# Patient Record
Sex: Male | Born: 1975 | Race: Black or African American | Hispanic: No | Marital: Single | Smoking: Current some day smoker
Health system: Southern US, Community
[De-identification: ages and names within clinical notes are randomized; demographics above are authoritative.]

## PROBLEM LIST (undated history)

## (undated) DIAGNOSIS — I82890 Acute embolism and thrombosis of other specified veins: Secondary | ICD-10-CM

## (undated) DIAGNOSIS — Z87442 Personal history of urinary calculi: Secondary | ICD-10-CM

## (undated) HISTORY — PX: OTHER SURGICAL HISTORY: SHX169

---

## 2005-04-28 ENCOUNTER — Emergency Department: Payer: Self-pay | Admitting: Emergency Medicine

## 2005-04-28 ENCOUNTER — Other Ambulatory Visit: Payer: Self-pay

## 2005-10-04 ENCOUNTER — Emergency Department: Payer: Self-pay | Admitting: Emergency Medicine

## 2006-07-15 ENCOUNTER — Emergency Department: Payer: Self-pay | Admitting: Emergency Medicine

## 2006-08-04 ENCOUNTER — Emergency Department: Payer: Self-pay | Admitting: Internal Medicine

## 2006-08-06 ENCOUNTER — Ambulatory Visit: Payer: Self-pay | Admitting: Internal Medicine

## 2006-10-04 ENCOUNTER — Emergency Department: Payer: Self-pay | Admitting: Unknown Physician Specialty

## 2007-02-27 ENCOUNTER — Emergency Department: Payer: Self-pay | Admitting: Emergency Medicine

## 2007-03-02 ENCOUNTER — Emergency Department: Payer: Self-pay | Admitting: Emergency Medicine

## 2007-03-03 ENCOUNTER — Emergency Department: Payer: Self-pay | Admitting: Emergency Medicine

## 2007-04-14 ENCOUNTER — Emergency Department: Payer: Self-pay | Admitting: Internal Medicine

## 2007-04-19 ENCOUNTER — Emergency Department: Payer: Self-pay | Admitting: Emergency Medicine

## 2007-06-10 ENCOUNTER — Emergency Department: Payer: Self-pay

## 2008-03-19 ENCOUNTER — Emergency Department: Payer: Self-pay | Admitting: Emergency Medicine

## 2009-08-23 ENCOUNTER — Emergency Department: Payer: Self-pay | Admitting: Emergency Medicine

## 2009-12-04 ENCOUNTER — Emergency Department: Payer: Self-pay | Admitting: Emergency Medicine

## 2009-12-17 ENCOUNTER — Emergency Department: Payer: Self-pay | Admitting: Emergency Medicine

## 2010-04-14 ENCOUNTER — Emergency Department: Payer: Self-pay | Admitting: Emergency Medicine

## 2010-11-14 ENCOUNTER — Emergency Department: Payer: Self-pay | Admitting: Internal Medicine

## 2012-05-12 ENCOUNTER — Emergency Department: Payer: Self-pay | Admitting: Emergency Medicine

## 2012-05-13 ENCOUNTER — Emergency Department: Payer: Self-pay | Admitting: Emergency Medicine

## 2012-12-17 ENCOUNTER — Emergency Department: Payer: Self-pay | Admitting: Emergency Medicine

## 2013-01-14 ENCOUNTER — Emergency Department: Payer: Self-pay | Admitting: Emergency Medicine

## 2013-01-14 LAB — COMPREHENSIVE METABOLIC PANEL
Albumin: 4 g/dL (ref 3.4–5.0)
Alkaline Phosphatase: 78 U/L (ref 50–136)
Anion Gap: 0 — ABNORMAL LOW (ref 7–16)
Bilirubin,Total: 0.3 mg/dL (ref 0.2–1.0)
Chloride: 106 mmol/L (ref 98–107)
Co2: 33 mmol/L — ABNORMAL HIGH (ref 21–32)
Glucose: 92 mg/dL (ref 65–99)
Osmolality: 279 (ref 275–301)
SGOT(AST): 28 U/L (ref 15–37)
Sodium: 139 mmol/L (ref 136–145)
Total Protein: 7.3 g/dL (ref 6.4–8.2)

## 2013-01-14 LAB — CBC
HGB: 14 g/dL (ref 13.0–18.0)
MCHC: 32.7 g/dL (ref 32.0–36.0)
Platelet: 186 10*3/uL (ref 150–440)

## 2013-01-14 LAB — URINALYSIS, COMPLETE
Bacteria: NONE SEEN
Blood: NEGATIVE
Glucose,UR: NEGATIVE mg/dL (ref 0–75)
Ketone: NEGATIVE
Nitrite: NEGATIVE
RBC,UR: 1 /HPF (ref 0–5)
WBC UR: 2 /HPF (ref 0–5)

## 2013-04-15 ENCOUNTER — Emergency Department: Payer: Self-pay | Admitting: Emergency Medicine

## 2013-08-31 ENCOUNTER — Emergency Department: Payer: Self-pay | Admitting: Emergency Medicine

## 2013-08-31 LAB — CBC WITH DIFFERENTIAL/PLATELET
Basophil #: 0.1 10*3/uL (ref 0.0–0.1)
Eosinophil %: 0.8 %
HCT: 42.3 % (ref 40.0–52.0)
HGB: 14.2 g/dL (ref 13.0–18.0)
Lymphocyte #: 1 10*3/uL (ref 1.0–3.6)
MCH: 28.9 pg (ref 26.0–34.0)
MCV: 87 fL (ref 80–100)
Monocyte #: 0.9 x10 3/mm (ref 0.2–1.0)
Neutrophil %: 78.9 %
Platelet: 196 10*3/uL (ref 150–440)
RBC: 4.89 10*6/uL (ref 4.40–5.90)
RDW: 16.2 % — ABNORMAL HIGH (ref 11.5–14.5)
WBC: 9.9 10*3/uL (ref 3.8–10.6)

## 2013-08-31 LAB — RAPID INFLUENZA A&B ANTIGENS

## 2013-08-31 LAB — BASIC METABOLIC PANEL
Calcium, Total: 9.7 mg/dL (ref 8.5–10.1)
Co2: 24 mmol/L (ref 21–32)
Creatinine: 1.05 mg/dL (ref 0.60–1.30)
EGFR (African American): >60
EGFR (Non-African Amer.): >60
Glucose: 166 mg/dL — ABNORMAL HIGH (ref 65–99)
Osmolality: 274 (ref 275–301)
Potassium: 3.5 mmol/L (ref 3.5–5.1)

## 2013-09-02 LAB — BETA STREP CULTURE(ARMC)

## 2013-09-05 LAB — CULTURE, BLOOD (SINGLE)

## 2014-01-03 ENCOUNTER — Emergency Department: Payer: Self-pay | Admitting: Emergency Medicine

## 2014-04-28 ENCOUNTER — Ambulatory Visit: Payer: Self-pay | Admitting: Orthopedic Surgery

## 2014-06-03 ENCOUNTER — Emergency Department: Payer: Self-pay | Admitting: Emergency Medicine

## 2015-02-08 ENCOUNTER — Emergency Department
Admission: EM | Admit: 2015-02-08 | Discharge: 2015-02-08 | Disposition: A | Discharge status: Home / Self Care | Payer: BLUE CROSS/BLUE SHIELD | Attending: Emergency Medicine | Admitting: Emergency Medicine

## 2015-02-08 DIAGNOSIS — Y998 Other external cause status: Secondary | ICD-10-CM | POA: Diagnosis not present

## 2015-02-08 DIAGNOSIS — Z720 Tobacco use: Secondary | ICD-10-CM | POA: Insufficient documentation

## 2015-02-08 DIAGNOSIS — Y288XXA Contact with other sharp object, undetermined intent, initial encounter: Secondary | ICD-10-CM | POA: Diagnosis not present

## 2015-02-08 DIAGNOSIS — Z23 Encounter for immunization: Secondary | ICD-10-CM | POA: Insufficient documentation

## 2015-02-08 DIAGNOSIS — Y92009 Unspecified place in unspecified non-institutional (private) residence as the place of occurrence of the external cause: Secondary | ICD-10-CM | POA: Insufficient documentation

## 2015-02-08 DIAGNOSIS — S41112A Laceration without foreign body of left upper arm, initial encounter: Secondary | ICD-10-CM | POA: Insufficient documentation

## 2015-02-08 DIAGNOSIS — Y9389 Activity, other specified: Secondary | ICD-10-CM | POA: Insufficient documentation

## 2015-02-08 HISTORY — DX: Acute embolism and thrombosis of other specified veins: I82.890

## 2015-02-08 MED ORDER — BACITRACIN 500 UNIT/GM EX OINT
1.0000 "application " | TOPICAL_OINTMENT | Freq: Two times a day (BID) | CUTANEOUS | Status: DC
  Administered 2015-02-08: 1 via TOPICAL
  Filled 2015-02-08: qty 0.9

## 2015-02-08 MED ORDER — LIDOCAINE-EPINEPHRINE (PF) 1 %-1:200000 IJ SOLN
30.0000 mL | Freq: Once | INTRAMUSCULAR | Status: AC
  Administered 2015-02-08: 30 mL

## 2015-02-08 MED ORDER — CEPHALEXIN 500 MG PO CAPS: 500.0000 mg | ORAL_CAPSULE | Freq: Four times a day (QID) | ORAL | Status: AC

## 2015-02-08 MED ORDER — OXYCODONE-ACETAMINOPHEN 5-325 MG PO TABS: 2.0000 | ORAL_TABLET | Freq: Once | ORAL | Status: DC

## 2015-02-08 MED ORDER — LIDOCAINE-EPINEPHRINE (PF) 2 %-1:200000 IJ SOLN: 20.0000 mL | Freq: Once | INTRAMUSCULAR | Status: DC

## 2015-02-08 MED ORDER — OXYCODONE-ACETAMINOPHEN 5-325 MG PO TABS: 1.0000 | ORAL_TABLET | Freq: Four times a day (QID) | ORAL | Status: DC | PRN

## 2015-02-08 MED ORDER — BACITRACIN ZINC 500 UNIT/GM EX OINT
TOPICAL_OINTMENT | CUTANEOUS | Status: AC
  Administered 2015-02-08: 1 via TOPICAL
  Filled 2015-02-08: qty 1.8

## 2015-02-08 MED ORDER — LIDOCAINE-EPINEPHRINE (PF) 1 %-1:200000 IJ SOLN
INTRAMUSCULAR | Status: AC
  Administered 2015-02-08: 30 mL
  Filled 2015-02-08: qty 30

## 2015-02-08 MED ORDER — TETANUS-DIPHTH-ACELL PERTUSSIS 5-2.5-18.5 LF-MCG/0.5 IM SUSP
0.5000 mL | Freq: Once | INTRAMUSCULAR | Status: AC
  Administered 2015-02-08: 0.5 mL via INTRAMUSCULAR

## 2015-02-08 MED ORDER — TETANUS-DIPHTH-ACELL PERTUSSIS 5-2.5-18.5 LF-MCG/0.5 IM SUSP
INTRAMUSCULAR | Status: AC
  Administered 2015-02-08: 0.5 mL via INTRAMUSCULAR
  Filled 2015-02-08: qty 0.5
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
## 2015-02-08 NOTE — ED Notes (Signed)
MD at bedside suturing

## 2015-02-08 NOTE — Discharge Instructions (Signed)

## 2015-02-09 ENCOUNTER — Emergency Department
Admission: EM | Admit: 2015-02-09 | Discharge: 2015-02-09 | Disposition: A | Discharge status: Home / Self Care | Payer: BLUE CROSS/BLUE SHIELD | Attending: Student | Admitting: Student

## 2015-02-09 ENCOUNTER — Encounter: Payer: Self-pay | Admitting: Emergency Medicine

## 2015-02-09 DIAGNOSIS — Z4801 Encounter for change or removal of surgical wound dressing: Secondary | ICD-10-CM | POA: Insufficient documentation

## 2015-02-09 DIAGNOSIS — Z720 Tobacco use: Secondary | ICD-10-CM | POA: Diagnosis not present

## 2015-02-09 DIAGNOSIS — Z5189 Encounter for other specified aftercare: Secondary | ICD-10-CM

## 2015-02-09 MED ORDER — BACITRACIN ZINC 500 UNIT/GM EX OINT
TOPICAL_OINTMENT | Freq: Two times a day (BID) | CUTANEOUS | Status: DC
  Administered 2015-02-09: 18:00:00 via TOPICAL

## 2015-02-09 MED ORDER — BACITRACIN ZINC 500 UNIT/GM EX OINT
TOPICAL_OINTMENT | CUTANEOUS | Status: AC
  Filled 2015-02-09: qty 0.9

## 2015-02-09 NOTE — Discharge Instructions (Signed)
Continue home medications. Keep clean with soap and water daily. Rinse, pat dry then apply thin layer of topical antibiotic ointment such as neosporin then apply dressing.   As discussed, return to ER for staple removal in 10-14 days.   Return to ER sooner for increased pain, redness, drainage, decreased range of motion or sensation, new or worsening concerns.

## 2015-02-09 NOTE — ED Notes (Signed)
Pt reports that he has a laceration and was told to come back and have his left arm relooked at. Denies any pain or s/s of infection
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
## 2015-02-24 ENCOUNTER — Encounter: Payer: Self-pay | Admitting: Emergency Medicine

## 2015-02-24 ENCOUNTER — Emergency Department
Admission: EM | Admit: 2015-02-24 | Discharge: 2015-02-24 | Disposition: A | Discharge status: Home / Self Care | Payer: BLUE CROSS/BLUE SHIELD | Attending: Emergency Medicine | Admitting: Emergency Medicine

## 2015-02-24 DIAGNOSIS — Z4802 Encounter for removal of sutures: Secondary | ICD-10-CM | POA: Diagnosis present

## 2015-02-24 DIAGNOSIS — Z4801 Encounter for change or removal of surgical wound dressing: Secondary | ICD-10-CM | POA: Diagnosis not present

## 2015-02-24 DIAGNOSIS — Z720 Tobacco use: Secondary | ICD-10-CM | POA: Insufficient documentation

## 2015-02-24 NOTE — ED Notes (Signed)
17 staples removed from left upper arm.

## 2015-02-24 NOTE — Discharge Instructions (Signed)

## 2015-02-24 NOTE — ED Notes (Signed)
Pt here for staple removal to left upper arm

## 2015-02-24 NOTE — ED Provider Notes (Signed)
[facility] Emergency Department Provider Note  ____________________________________________  Time seen: 1206  I have reviewed the triage vital signs and the nursing notes.   HISTORY  Chief Complaint Suture / Staple Removal   HPI Anthony Levine is a 39 y.o. male is here today for wound check and staple removal. He has a stapled laceration on his left forearm. This was done 15 days ago. Patient denies any problems.   Past Medical History  Diagnosis Date  . Blood clot in abdominal vein     There are no active problems to display for this patient.   Past Surgical History  Procedure Laterality Date  . Blood clot removal from abdomen      Current Outpatient Rx  Name  Route  Sig  Dispense  Refill  . oxyCODONE-acetaminophen (ROXICET) 5-325 MG per tablet   Oral   Take 1 tablet by mouth every 6 (six) hours as needed.   20 tablet   0     Allergies Review of patient's allergies indicates no known allergies.  No family history on file.  Social History History  Substance Use Topics  . Smoking status: Current Every Day Smoker -- 0.50 packs/day    Types: Cigarettes  . Smokeless tobacco: Not on file  . Alcohol Use: No    Review of Systems Constitutional: No fever/chills _________________________________________   PHYSICAL EXAM:  VITAL SIGNS: ED Triage Vitals  Enc Vitals Group     BP --      Pulse --      Resp --      Temp --      Temp src --      SpO2 --      Weight --      Height --      Head Cir --      Peak Flow --      Pain Score --      Pain Loc --      Pain Edu? --      Excl. in GC? --     Constitutional: Alert and oriented. Well appearing and in no acute distress. Eyes: Conjunctivae are normal. PERRL. EOMI. Head: Atraumatic. Nose: No congestion/rhinnorhea. Neck: No stridor.   Respiratory: Normal respiratory effort. Musculoskeletal: No lower extremity tenderness nor edema.  No joint effusions. Neurologic:   Normal speech and language. No gross focal neurologic deficits are appreciated. Speech is normal. No gait instability. Skin:  Skin is warm, dry and intact. Laceration is healed well without any signs of infection. Psychiatric: Mood and affect are normal. Speech and behavior are normal.  ____________________________________________   LABS (all labs ordered are listed, but only abnormal results are displayed)  Labs Reviewed - No data to display PROCEDURES  Procedure(s) performed: Staples were removed by RN  Critical Care performed: No  ____________________________________________   INITIAL IMPRESSION / ASSESSMENT AND PLAN / ED COURSE  Pertinent labs & imaging results that were available during my care of the patient were reviewed by me and considered in my medical decision making (see chart for details).  Patient [facility] if any problems [facility]____________   FINAL CLINICAL IMPRESSION(S) / ED DIAGNOSES  Final diagnoses:  Encounter for staple removal      Tommi Rumpshonda L Summers, PA-C 02/24/15 1215

## 2015-07-25 ENCOUNTER — Emergency Department: Payer: BLUE CROSS/BLUE SHIELD

## 2015-07-25 ENCOUNTER — Emergency Department
Admission: EM | Admit: 2015-07-25 | Discharge: 2015-07-25 | Disposition: A | Discharge status: Home / Self Care | Payer: BLUE CROSS/BLUE SHIELD | Attending: Emergency Medicine | Admitting: Emergency Medicine

## 2015-07-25 ENCOUNTER — Encounter: Payer: Self-pay | Admitting: Adult Health

## 2015-07-25 DIAGNOSIS — M6588 Other synovitis and tenosynovitis, other site: Secondary | ICD-10-CM | POA: Diagnosis not present

## 2015-07-25 DIAGNOSIS — Z720 Tobacco use: Secondary | ICD-10-CM | POA: Diagnosis not present

## 2015-07-25 DIAGNOSIS — G8929 Other chronic pain: Secondary | ICD-10-CM | POA: Diagnosis not present

## 2015-07-25 DIAGNOSIS — M19132 Post-traumatic osteoarthritis, left wrist: Secondary | ICD-10-CM | POA: Diagnosis not present

## 2015-07-25 DIAGNOSIS — M25532 Pain in left wrist: Secondary | ICD-10-CM

## 2015-07-25 DIAGNOSIS — M778 Other enthesopathies, not elsewhere classified: Secondary | ICD-10-CM

## 2015-07-25 MED ORDER — MELOXICAM 15 MG PO TABS: 15.0000 mg | ORAL_TABLET | Freq: Every day | ORAL | Status: DC

## 2015-07-25 NOTE — ED Notes (Signed)
Presents with left wrist pain began 6 months ago- endorses difficulty picking things up, trouble moving wrist from side to side. Cms intact
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
## 2015-07-25 NOTE — Discharge Instructions (Signed)
Emergency care providers appreciate that many patients coming to us are in severe pain and we wish to address their pain in the safest, most responsible manner.  It is important to recognize, however, that the proper treatment of chronic pain differs from that of the pain of injuries and acute illnesses.  Our goal is to provider quality, safe, personalized care and we thank you for giving us the opportunity to serve you.  The use of narcotics and related agents for chronic pain syndromes may lead to additional physical and psychological problems.  Nearly as many people die from prescription narcotics each year as die from car crashes.  Additionally, this risk is increased if such prescriptions are obtained from a variety of sources.  Therefore, only your primary care physician or a pain management specialist is able to safely treat such syndromes with narcotic medications long-term.  Documentation revealing such prescriptions have been sought from multiple sources may prohibit us from providing a refill or different narcotic medication.  Your name may be checked first through the [place] Controlled Substances Reporting System.  This database is a record of controlled substance medication prescriptions that the patient has received.  This has been established by [place] in an effort to eliminate the dangerous, and often life-threatening, practice of obtaining multiple prescriptions from different medical providers.  If you have a chronic pain syndrome (i.e. chronic headaches, recurrent back or neck pain, dental pain, abdominal or pelvic pain without a specific diagnosis, or neuropathic pain such as fibromyalgia) or recurrent visits for the same condition without an acute diagnosis, you may be treated with non-narcotics and other non-addictive medicines.  Allergic reactions or negative side effects that may be reported by a patient to such medications will not typically lead to the use of a  narcotic analgesic or other controlled substance as an alternative.  Patients managing chronic pain with a personal physician should have provisions in place for breakthrough pain.  If you are in crisis, you should call your physician.  If your physician directs you to the emergency department, please have the doctor call and speak to our attending physician concerning your care.  When patients come to the Emergency Department (ED) with acute medical conditions in which the ED physician feels it is appropriate to prescribe narcotic or sedating pain medication, the physician will prescribe these in very limited quantities.  The amount of these medications will last only until you can see your primary care physician in his/her office.  Any patient who returns to the ED seeking refills should expect only non-narcotic pain medications.  In the event an acute medical condition exists and the emergency physician feels it is necessary that the patient be given a narcotic or sedating medication, a responsible adult driver should be present in the room prior to the medication being given by the nurse.  Prescriptions for narcotic or sedating medications that have been lost, stolen, or expired will NOT be refilled in the ED.  Patients who have chronic pain may receive non-narcotic prescriptions until seen by their primary care physician.  It is every patient's personal responsibility to maintain active prescriptions with his or her primary care physician or specialist.  

## 2015-10-27 ENCOUNTER — Encounter: Payer: Self-pay | Admitting: Emergency Medicine

## 2015-10-27 ENCOUNTER — Emergency Department
Admission: EM | Admit: 2015-10-27 | Discharge: 2015-10-27 | Disposition: A | Discharge status: Home / Self Care | Payer: BLUE CROSS/BLUE SHIELD | Attending: Student | Admitting: Student

## 2015-10-27 DIAGNOSIS — N4889 Other specified disorders of penis: Secondary | ICD-10-CM | POA: Diagnosis present

## 2015-10-27 DIAGNOSIS — L739 Follicular disorder, unspecified: Secondary | ICD-10-CM | POA: Insufficient documentation

## 2015-10-27 DIAGNOSIS — F17210 Nicotine dependence, cigarettes, uncomplicated: Secondary | ICD-10-CM | POA: Insufficient documentation

## 2015-10-27 DIAGNOSIS — Z791 Long term (current) use of non-steroidal anti-inflammatories (NSAID): Secondary | ICD-10-CM | POA: Insufficient documentation

## 2015-10-27 MED ORDER — CEPHALEXIN 500 MG PO CAPS: 500.0000 mg | ORAL_CAPSULE | Freq: Four times a day (QID) | ORAL | Status: DC

## 2015-10-27 NOTE — ED Provider Notes (Signed)
[facility] Emergency Department Provider Note  ____________________________________________  Time seen: Approximately 3:16 PM  I have reviewed the triage vital signs and the nursing notes.   HISTORY  Chief Complaint "bump on penis"     HPI Anthony Levine is a 40 y.o. male who presents to emergency department complaining of a "bump" on his penis. Patient states that he was shaving approximately a week ago and afterwards he noticed a bump form. He states that he squeezed it and "popped it." He does endorse some redness, swelling, pain to the area. Patient has not tried any medications prior to arrival. Patient denies fever chills, nausea vomiting, abdominal pain. He denies any dysuria, hematuria, polyuria, or penile discharge.   Past Medical History  Diagnosis Date  . Blood clot in abdominal vein     There are no active problems to display for this patient.   Past Surgical History  Procedure Laterality Date  . Blood clot removal from abdomen      Current Outpatient Rx  Name  Route  Sig  Dispense  Refill  . cephALEXin (KEFLEX) 500 MG capsule   Oral   Take 1 capsule (500 mg total) by mouth 4 (four) times daily.   28 capsule   0   . meloxicam (MOBIC) 15 MG tablet   Oral   Take 1 tablet (15 mg total) by mouth daily.   30 tablet   2   . oxyCODONE-acetaminophen (ROXICET) 5-325 MG per tablet   Oral   Take 1 tablet by mouth every 6 (six) hours as needed.   20 tablet   0     Allergies Tylenol  History reviewed. No pertinent family history.  Social History Social History  Substance Use Topics  . Smoking status: Current Every Day Smoker -- 0.50 packs/day    Types: Cigarettes  . Smokeless tobacco: None  . Alcohol Use: No     Review of Systems  Constitutional: No fever/chills Eyes: No visual changes. No discharge ENT: No sore throat. Cardiovascular: no chest pain. Respiratory: no cough. No SOB. Gastrointestinal: No  abdominal pain.  No nausea, no vomiting.  No diarrhea.  No constipation. Genitourinary: Negative for dysuria. No hematuria. positive for "bump"on penis Musculoskeletal: Negative for back pain. Skin: Negative for rash. Neurological: Negative for headaches, focal weakness or numbness. 10-point ROS otherwise negative.  ____________________________________________   PHYSICAL EXAM:  VITAL SIGNS: ED Triage Vitals  Enc Vitals Group     BP 10/27/15 1428 151/101 mmHg     Pulse Rate 10/27/15 1428 103     Resp 10/27/15 1428 18     Temp 10/27/15 1428 98.3 F (36.8 C)     Temp Source 10/27/15 1428 Oral     SpO2 10/27/15 1428 97 %     Weight 10/27/15 1428 185 lb (83.915 kg)     Height 10/27/15 1428  (1.803 m)     Head Cir --      Peak Flow --      Pain Score --      Pain Loc --      Pain Edu? --      Excl. in GC? --      Constitutional: Alert and oriented. Well appearing and in no acute distress. Eyes: Conjunctivae are normal. PERRL. EOMI. Head: Atraumatic. ENT:      Ears:       Nose: No congestion/rhinnorhea.      Mouth/Throat: Mucous membranes are moist.  Neck: No stridor.  Hematological/Lymphatic/Immunilogical: No cervical lymphadenopathy. Cardiovascular: Normal rate, regular rhythm. Normal S1 and S2.  Good peripheral circulation. Respiratory: Normal respiratory effort without tachypnea or retractions. Lungs CTAB. Gastrointestinal: Soft and nontender. No distention. No CVA tenderness. Genitourinary: Erythematous, edematous lesion noted to the base of the penis. No drainage noted. No fluctuance noted. Area is firm to palpation. Area is tender to palpation. No chancres. No discharge. Testicular exam is unremarkable. Musculoskeletal: No lower extremity tenderness nor edema.  No joint effusions. Neurologic:  Normal speech and language. No gross focal neurologic deficits are appreciated.  Skin:  Skin is warm, dry and intact. No rash noted. Psychiatric: Mood and affect are  normal. Speech and behavior are normal. Patient exhibits appropriate insight and judgement.   ____________________________________________   LABS (all labs ordered are listed, but only abnormal results are displayed)  Labs Reviewed - No data to display ____________________________________________  EKG   ____________________________________________  RADIOLOGY   No results found.  ____________________________________________    PROCEDURES  Procedure(s) performed:       Medications - No data to display   ____________________________________________   INITIAL IMPRESSION / ASSESSMENT AND PLAN / ED COURSE  Pertinent labs & imaging results that were available during my care of the patient were reviewed by me and considered in my medical decision making (see chart for details).  Patient's diagnosis is consistent with folliculitis. Patient will be discharged home with prescriptions for antibiotics. Patient is to follow up with primary care provider if symptoms persist past this treatment course. Patient is given ED precautions to return to the ED for any worsening or new symptoms.     ____________________________________________  FINAL CLINICAL IMPRESSION(S) / ED DIAGNOSES  Final diagnoses:  Folliculitis      NEW MEDICATIONS STARTED DURING THIS VISIT:  New Prescriptions   CEPHALEXIN (KEFLEX) 500 MG CAPSULE    Take 1 capsule (500 mg total) by mouth 4 (four) times daily.        Delorise Royals Keyshla Tunison, PA-C 10/27/15 1522  Gayla Doss, MD 10/28/15 [phone]

## 2015-10-27 NOTE — Discharge Instructions (Signed)
Folliculitis °Folliculitis is redness, soreness, and swelling (inflammation) of the hair follicles. This condition can occur anywhere on the body. People with weakened immune systems, diabetes, or obesity have a greater risk of getting folliculitis. °CAUSES °· Bacterial infection. This is the most common cause. °· Fungal infection. °· Viral infection. °· Contact with certain chemicals, especially oils and tars. °Long-term folliculitis can result from bacteria that live in the nostrils. The bacteria may trigger multiple outbreaks of folliculitis over time. °SYMPTOMS °Folliculitis most commonly occurs on the scalp, thighs, legs, back, buttocks, and areas where hair is shaved frequently. An early sign of folliculitis is a small, white or yellow, pus-filled, itchy lesion (pustule). These lesions appear on a red, inflamed follicle. They are usually less than 0.2 inches (5 mm) wide. When there is an infection of the follicle that goes deeper, it becomes a boil or furuncle. A group of closely packed boils creates a larger lesion (carbuncle). Carbuncles tend to occur in hairy, sweaty areas of the body. °DIAGNOSIS  °Your caregiver can usually tell what is wrong by doing a physical exam. A sample may be taken from one of the lesions and tested in a lab. This can help determine what is causing your folliculitis. °TREATMENT  °Treatment may include: °· Applying warm compresses to the affected areas. °· Taking antibiotic medicines orally or applying them to the skin. °· Draining the lesions if they contain a large amount of pus or fluid. °· Laser hair removal for cases of long-lasting folliculitis. This helps to prevent regrowth of the hair. °HOME CARE INSTRUCTIONS °· Apply warm compresses to the affected areas as directed by your caregiver. °· If antibiotics are prescribed, take them as directed. Finish them even if you start to feel better. °· You may take over-the-counter medicines to relieve itching. °· Do not shave irritated  skin. °· Follow up with your caregiver as directed. °SEEK IMMEDIATE MEDICAL CARE IF:  °· You have increasing redness, swelling, or pain in the affected area. °· You have a fever. °MAKE SURE YOU: °· Understand these instructions. °· Will watch your condition. °· Will get help right away if you are not doing well or get worse. °  °This information is not intended to replace advice given to you by your health care provider. Make sure you discuss any questions you have with your health care provider. °  °Document Released: 11/27/2001 Document Revised: 10/09/2014 Document Reviewed: 12/19/2011 °Elsevier Interactive Patient Education ©2016 Elsevier Inc. ° °

## 2015-10-27 NOTE — ED Notes (Signed)
Pt reports "hair bump" to penis.  Started one week ago. Reports he popped it today.

## 2015-12-27 ENCOUNTER — Emergency Department
Admission: EM | Admit: 2015-12-27 | Discharge: 2015-12-27 | Disposition: A | Discharge status: Home / Self Care | Payer: BLUE CROSS/BLUE SHIELD | Attending: Emergency Medicine | Admitting: Emergency Medicine

## 2015-12-27 DIAGNOSIS — Z886 Allergy status to analgesic agent status: Secondary | ICD-10-CM | POA: Diagnosis not present

## 2015-12-27 DIAGNOSIS — F17210 Nicotine dependence, cigarettes, uncomplicated: Secondary | ICD-10-CM | POA: Insufficient documentation

## 2015-12-27 DIAGNOSIS — K0889 Other specified disorders of teeth and supporting structures: Secondary | ICD-10-CM | POA: Diagnosis present

## 2015-12-27 DIAGNOSIS — K047 Periapical abscess without sinus: Secondary | ICD-10-CM

## 2015-12-27 MED ORDER — HYDROCODONE-ACETAMINOPHEN 5-325 MG PO TABS
1.0000 | ORAL_TABLET | ORAL | Status: DC | PRN
Start: 2015-12-27 — End: 2016-12-26

## 2015-12-27 MED ORDER — LIDOCAINE VISCOUS 2 % MT SOLN: 20.0000 mL | OROMUCOSAL | Status: DC | PRN

## 2015-12-27 MED ORDER — AMOXICILLIN 500 MG PO TABS: 500.0000 mg | ORAL_TABLET | Freq: Three times a day (TID) | ORAL | Status: DC

## 2015-12-27 NOTE — ED Provider Notes (Signed)
[facility] Emergency Department Provider Note  ____________________________________________  Time seen: Approximately 10:20 AM  I have reviewed the triage vital signs and the nursing notes.   HISTORY  Chief Complaint Dental Pain    HPI Anthony Levine is a 40 y.o. male    Past Medical History  Diagnosis Date  . Blood clot in abdominal vein     There are no active problems to display for this patient.   Past Surgical History  Procedure Laterality Date  . Blood clot removal from abdomen      Current Outpatient Rx  Name  Route  Sig  Dispense  Refill  . amoxicillin (AMOXIL) 500 MG tablet   Oral   Take 1 tablet (500 mg total) by mouth 3 (three) times daily.   30 tablet   0   . HYDROcodone-acetaminophen (NORCO) 5-325 MG tablet   Oral   Take 1-2 tablets by mouth every 4 (four) hours as needed for moderate pain.   15 tablet   0   . lidocaine (XYLOCAINE) 2 % solution   Mouth/Throat   Use as directed 20 mLs in the mouth or throat as needed for mouth pain.   100 mL   0     Allergies Tylenol  No family history on file.  Social History Social History  Substance Use Topics  . Smoking status: Current Every Day Smoker -- 0.50 packs/day    Types: Cigarettes  . Smokeless tobacco: None  . Alcohol Use: No    Review of Systems Constitutional: No fever/chills Eyes: No visual changes. ENT: Positive for dental pain Cardiovascular: Denies chest pain. Musculoskeletal: Negative for back pain. Skin: Negative for rash. Neurological: Negative for headaches, focal weakness or numbness.  10-point ROS otherwise negative.  ____________________________________________   PHYSICAL EXAM:  VITAL SIGNS: ED Triage Vitals  Enc Vitals Group     BP 12/27/15 0922 153/90 mmHg     Pulse Rate 12/27/15 0922 68     Resp 12/27/15 0922 17     Temp 12/27/15 0922 97.7 F (36.5 C)     Temp Source 12/27/15 0922 Oral     SpO2 12/27/15 0922 100 %      Weight 12/27/15 0922 175 lb (79.379 kg)     Height 12/27/15 0922 5\' 11"  (1.803 m)     Head Cir --      Peak Flow --      Pain Score 12/27/15 0923 10     Pain Loc --      Pain Edu? --      Excl. in GC? --     Constitutional: Alert and oriented. Well appearing and in no acute distress. Mouth/Throat: Mucous membranes are moist.  Oropharynx non-erythematous. Positive dental caries right upper molar with extreme tenderness and erythema around the gums. Neck: No stridor.   Cardiovascular: Normal rate, regular rhythm. Grossly normal heart sounds.  Good peripheral circulation. Respiratory: Normal respiratory effort.  No retractions. Lungs CTAB. Neurologic:  Normal speech and language. No gross focal neurologic deficits are appreciated. No gait instability. Psychiatric: Mood and affect are normal. Speech and behavior are normal.  ____________________________________________   LABS (all labs ordered are listed, but only abnormal results are displayed)  Labs Reviewed - No data to display ____________________________________________    PROCEDURES  Procedure(s) performed: None  Critical Care performed: No  ____________________________________________   INITIAL IMPRESSION / ASSESSMENT AND PLAN / ED COURSE  Pertinent labs & imaging results that were available during my care  of the patient were reviewed by me and considered in my medical decision making (see chart for details).  Acute dental pain with abscess. Rx given for amoxicillin 500 mg twice a day and Vicodin 5/325. Viscous lidocaine given. A list of dental providers in the area was also given to the patient. Work excuse 24 hours. ____________________________________________   FINAL CLINICAL IMPRESSION(S) / ED DIAGNOSES  Final diagnoses:  Dental abscess     This chart was dictated using voice recognition software/Dragon. Despite best efforts to proofread, errors can occur which can change the meaning. Any change was  purely unintentional.   Evangeline Dakin, PA-C 12/27/15 [address] Anthony Whitelaw, PA-C 12/29/15 1515  Rockne Menghini, MD 12/29/15 1845

## 2015-12-27 NOTE — Discharge Instructions (Signed)
OPTIONS FOR DENTAL FOLLOW UP CARE ° °[place] Department of Health and Human Services - Local Safety Net Dental Clinics °http://www.ncdhhs.gov/dph/oralhealth/services/safetynetclinics.htm °  °[facility] (336-562-3123) ° °[facility] ([phone]) ° °[facility] ([phone] ext [phone]) ° °[facility] ([phone]) ° °[facility] ([phone]) °This clinic caters to the indigent population and is on a lottery system. °Location: °[facility], [address], [place] °Clinic Hours: °Wednesdays from 6pm - 9pm, patients seen by a lottery system. °For dates, call or go to [email or web address]/[facility]/patients/[email or web address] °Services: °Cleanings, fillings and simple extractions. °Payment Options: °DENTAL WORK IS FREE OF CHARGE. Bring proof of income or support. °Best way to get seen: °Arrive at 5:15 pm - this is a lottery, NOT first come/first serve, so arriving earlier will not increase your chances of being seen. °  °  °[facility] °[phone] °Select option 1 for emergencies °  °Location: °[facility], [address], [place] °Clinic Hours: °No walk-ins accepted - call the day before to schedule an appointment. °Check in times are 9:30 am and 1:30 pm. °Services: °Simple extractions, temporary fillings, pulpectomy/pulp debridement, uncomplicated abscess drainage. °Payment Options: °PAYMENT IS DUE AT THE TIME OF SERVICE.  Fee is usually $100-200, additional surgical procedures (e.g. abscess drainage) may be extra. °Cash, checks, Visa/MasterCard accepted.  Can file Medicaid if patient is covered for dental - patient should call case worker to check. °No discount for [facility] patients. °Best way to get seen: °MUST call the day before and get onto the schedule. Can usually be seen the next 1-2 days. No walk-ins accepted. °  °  °[facility] °[phone] °   °Location: °[facility], [address], [place] °Clinic Hours: °M, W, Th, F 8am or 1:30pm, Tues 9a or 1:30 - first come/first served. °Services: °Simple extractions, temporary fillings, uncomplicated abscess drainage.  You do not need to be an [place] resident. °Payment Options: °PAYMENT IS DUE AT THE TIME OF SERVICE. °Dental insurance, otherwise sliding scale - bring proof of income or support. °Depending on income and treatment needed, cost is usually $50-200. °Best way to get seen: °Arrive early as it is first come/first served. °  °  °[facility] °[phone] °  °Location: °[address] °Clinic Hours: °Mon-Thu 8a-5p °Services: °Most basic dental services including extractions and fillings. °Payment Options: °PAYMENT IS DUE AT THE TIME OF SERVICE. °Sliding scale, up to 50% off - bring proof if income or support. °Medicaid with dental option accepted. °Best way to get seen: °Call to schedule an appointment, can usually be seen within 2 weeks OR they will try to see walk-ins - show up at 8a or 2p (you may have to wait). °  °  °[facility] °[phone] °[place] RESIDENTS ONLY °  °Location: °[facility], [address], [place],  [address] °Clinic Hours: By appointment only. °Monday - Thursday 8am-5pm, Friday 8am-12pm °Services: Cleanings, fillings, extractions. °Payment Options: °PAYMENT IS DUE AT THE TIME OF SERVICE. °Cash, Visa or MasterCard. Sliding scale - $30 minimum per service. °Best way to get seen: °Come in to office, complete packet and make an appointment - need proof of income °or support monies for each household member and proof of [place] residence. °Usually takes about a month to get in. °  °  °[facility] °[phone] °  °Location: °[address]   Minor Clinic Hours: Walk-in Urgent Care Dental Services are offered Monday-Friday  mornings only. The numbers of emergencies accepted daily is limited to the number of providers available. Maximum 15 - Mondays, Wednesdays & Thursdays Maximum 10 - Tuesdays & Fridays Services: You do not need to be a [place] resident to be seen for a dental emergency. Emergencies are defined as pain, swelling, abnormal bleeding, or dental trauma. Walkins will receive x-rays if needed. NOTE: Dental cleaning is not an emergency. Payment Options: PAYMENT IS DUE AT THE TIME OF SERVICE. Minimum co-pay is $40.00 for uninsured patients. Minimum co-pay is $3.00 for Medicaid with dental coverage. Dental Insurance is accepted and must be presented at time of visit. Medicare does not cover dental. Forms of payment: Cash, credit card, checks. Best way to get seen: If not previously registered with the clinic, walk-in dental registration begins at 7:15 am and is on a first come/first serve basis. If previously registered with the clinic, call to make an appointment.     [facility] [phone] [place] RESIDENTS ONLY   Location: [address], [place], [place] Clinic Hours: Mon-Thu 10a-2p Services: Extractions only! Payment Options: FREE (donations accepted) - bring proof of income or support Best way to get seen: Call and schedule an appointment OR come at 8am on the 1st Monday of every month (except for holidays) when it is first come/first served.     [facility] [phone]   Location: [address], [place] Clinic Hours: Friday mornings Services, Payment Options, Best way to get seen: Call for info Abscess An abscess is an infected area that contains a collection of pus and debris.It can occur in almost any part of the body. An abscess is also known as a furuncle or boil. CAUSES  An abscess occurs when tissue gets infected. This can occur from blockage of oil or sweat glands, infection of hair follicles, or a minor injury to the skin. As the body  tries to fight the infection, pus collects in the area and creates pressure under the skin. This pressure causes pain. People with weakened immune systems have difficulty fighting infections and get certain abscesses more often.  SYMPTOMS Usually an abscess develops on the skin and becomes a painful mass that is red, warm, and tender. If the abscess forms under the skin, you may feel a moveable soft area under the skin. Some abscesses break open (rupture) on their own, but most will continue to get worse without care. The infection can spread deeper into the body and eventually into the bloodstream, causing you to feel ill.  DIAGNOSIS  Your caregiver will take your medical history and perform a physical exam. A sample of fluid may also be taken from the abscess to determine what is causing your infection. TREATMENT  Your caregiver may prescribe antibiotic medicines to fight the infection. However, taking antibiotics alone usually does not cure an abscess. Your caregiver may need to make a small cut (incision) in the abscess to drain the pus. In some cases, gauze is packed into the abscess to reduce pain and to continue draining the area. HOME CARE INSTRUCTIONS   Only take over-the-counter or prescription medicines for pain, discomfort, or fever as directed by your caregiver.  If you were prescribed antibiotics, take them as directed. Finish them even if you start to feel better.  If gauze is used, follow your caregiver's directions for changing the gauze.  To avoid spreading the infection:  Keep your draining  abscess covered with a bandage.  Wash your hands well.  Do not share personal care items, towels, or whirlpools with others.  Avoid skin contact with others.  Keep your skin and clothes clean around the abscess.  Keep all follow-up appointments as directed by your caregiver. SEEK MEDICAL CARE IF:   You have increased pain, swelling, redness, fluid drainage, or bleeding.  You have  muscle aches, chills, or a general ill feeling.  You have a fever. MAKE SURE YOU:   Understand these instructions.  Will watch your condition.  Will get help right away if you are not doing well or get worse.   This information is not intended to replace advice given to you by your health care provider. Make sure you discuss any questions you have with your health care provider.   Document Released: 06/28/2005 Document Revised: 03/19/2012 Document Reviewed: 12/01/2011 Elsevier Interactive Patient Education [employer]2016 Elsevier Inc.

## 2015-12-27 NOTE — ED Notes (Signed)
States pain to right side of face for the past 2 days   Denies any trauma to that side of face and denies any broken tooth

## 2015-12-27 NOTE — ED Notes (Signed)
Pt c/o right upper toothache for the past couple of days

## 2016-01-23 ENCOUNTER — Emergency Department
Admission: EM | Admit: 2016-01-23 | Discharge: 2016-01-23 | Disposition: A | Discharge status: Home / Self Care | Payer: BLUE CROSS/BLUE SHIELD | Attending: Emergency Medicine | Admitting: Emergency Medicine

## 2016-01-23 ENCOUNTER — Encounter: Payer: Self-pay | Admitting: Emergency Medicine

## 2016-01-23 DIAGNOSIS — B360 Pityriasis versicolor: Secondary | ICD-10-CM | POA: Diagnosis not present

## 2016-01-23 DIAGNOSIS — F17210 Nicotine dependence, cigarettes, uncomplicated: Secondary | ICD-10-CM | POA: Insufficient documentation

## 2016-01-23 DIAGNOSIS — R21 Rash and other nonspecific skin eruption: Secondary | ICD-10-CM | POA: Diagnosis present

## 2016-01-23 MED ORDER — HYDROXYZINE PAMOATE 25 MG PO CAPS: 25.0000 mg | ORAL_CAPSULE | Freq: Three times a day (TID) | ORAL | Status: DC | PRN

## 2016-01-23 MED ORDER — FLUCONAZOLE 100 MG PO TABS: 100.0000 mg | ORAL_TABLET | ORAL | Status: AC

## 2016-01-23 MED ORDER — KETOCONAZOLE 2 % EX CREA: 1.0000 "application " | TOPICAL_CREAM | Freq: Every day | CUTANEOUS | Status: DC

## 2016-01-23 NOTE — Discharge Instructions (Signed)
Tinea Versicolor Tinea versicolor is a common fungal infection of the skin. It causes a rash that appears as light or dark patches on the skin. The rash most often occurs on the chest, back, neck, or upper arms. This condition is more common during warm weather. Other than affecting how your skin looks, tinea versicolor usually does not cause other problems. In most cases, the infection goes away in a few weeks with treatment. It may take a few months for the patches on your skin to clear up. CAUSES Tinea versicolor occurs when a type of fungus that is normally present on the skin starts to overgrow. This fungus is a kind of yeast. The exact cause of the overgrowth is not known. This condition cannot be passed from one person to another (noncontagious). RISK FACTORS This condition is more likely to develop when certain factors are present, such as:  Heat and humidity.  Sweating too much.  Hormone changes.  Oily skin.  A weak defense (immune) system. SYMPTOMS Symptoms of this condition may include:  A rash on your skin that is made up of light or dark patches. The rash may have:  Patches of tan or pink spots on light skin.  Patches of white or brown spots on dark skin.  Patches of skin that do not tan.  Well-marked edges.  Scales on the discolored areas.  Mild itching. DIAGNOSIS A health care provider can usually diagnose this condition by looking at your skin. During the exam, he or she may use ultraviolet light to help determine the extent of the infection. In some cases, a skin sample may be taken by scraping the rash. This sample will be viewed under a microscope to check for yeast overgrowth. TREATMENT Treatment for this condition may include:  Dandruff shampoo that is applied to the affected skin during showers or bathing.  Over-the-counter medicated skin cream, lotion, or soaps.  Prescription antifungal medicine in the form of skin cream or pills.  Medicine to help  reduce itching. HOME CARE INSTRUCTIONS  Take medicines only as directed by your health care provider.  Apply dandruff shampoo to the affected area if told to do so by your health care provider. You may be instructed to scrub the affected skin for several minutes each day.  Do not scratch the affected area of skin.  Avoid hot and humid conditions.  Do not use tanning booths.  Try to avoid sweating a lot. SEEK MEDICAL CARE IF:  Your symptoms get worse.  You have a fever.  You have redness, swelling, or pain at the site of your rash.  You have fluid, blood, or pus coming from your rash.  Your rash returns after treatment.   This information is not intended to replace advice given to you by your health care provider. Make sure you discuss any questions you have with your health care provider.   Document Released: 09/15/2000 Document Revised: 10/09/2014 Document Reviewed: 06/30/2014 Elsevier Interactive Patient Education 2016 Elsevier Inc.  

## 2016-01-23 NOTE — ED Provider Notes (Signed)
[facility] Emergency Department Provider Note  ____________________________________________  Time seen: Approximately 2:29 PM  I have reviewed the triage vital signs and the nursing notes.   HISTORY  Chief Complaint Rash    HPI Anthony Levine is a 40 y.o. male , NAD, presents to the emergency department with a few week history of rash about his face and scalp. States he's had this rash off and on since he was a child. Has been treated by dermatology in the past. The last time that he when he was given a medicated shampoo which she felt made the rash and itching worse. Notes that the rash was further spread about his body as a child including his back but has no complaints of diffuse rash at this time. As noted that the rash is itchy, paralleling comparison to his normal skin tone and only affects his scalp and around the right side of his neck. He believes the rash was manifested after taking amoxicillin for tooth abscess approximately one month ago. Denies any cough, wheeze, shortness breath, swelling about the tongue, throat, mouth. No known exposures. He has not treated the rash with anything over-the-counter at this time.   Past Medical History  Diagnosis Date  . Blood clot in abdominal vein     There are no active problems to display for this patient.   Past Surgical History  Procedure Laterality Date  . Blood clot removal from abdomen      Current Outpatient Rx  Name  Route  Sig  Dispense  Refill  . amoxicillin (AMOXIL) 500 MG tablet   Oral   Take 1 tablet (500 mg total) by mouth 3 (three) times daily.   30 tablet   0   . fluconazole (DIFLUCAN) 100 MG tablet   Oral   Take 1 tablet (100 mg total) by mouth once a week. for 2 weeks   2 tablet   0   . HYDROcodone-acetaminophen (NORCO) 5-325 MG tablet   Oral   Take 1-2 tablets by mouth every 4 (four) hours as needed for moderate pain.   15 tablet   0   . hydrOXYzine (VISTARIL)  25 MG capsule   Oral   Take 1 capsule (25 mg total) by mouth 3 (three) times daily as needed.   21 capsule   0   . ketoconazole (NIZORAL) 2 % cream   Topical   Apply 1 application topically daily.   30 g   0   . lidocaine (XYLOCAINE) 2 % solution   Mouth/Throat   Use as directed 20 mLs in the mouth or throat as needed for mouth pain.   100 mL   0     Allergies Tylenol  History reviewed. No pertinent family history.  Social History Social History  Substance Use Topics  . Smoking status: Current Every Day Smoker -- 0.50 packs/day    Types: Cigarettes  . Smokeless tobacco: None  . Alcohol Use: No     Review of Systems  Constitutional: No fever/chills, fatigue Eyes: No visual changes. No discharge, redness, swelling ENT: No sore throat or tongue swelling, throat swelling. Cardiovascular: No chest pain. Respiratory: No cough. No shortness of breath. No wheezing.  Gastrointestinal: No abdominal pain.  No nausea, vomiting. Musculoskeletal: Negative for neck pain.  Skin: Positive for rash. Neurological: Negative for headaches, focal weakness or numbness. 10-point ROS otherwise negative.  ____________________________________________   PHYSICAL EXAM:  VITAL SIGNS: ED Triage Vitals  Enc Vitals Group  BP 01/23/16 1321 153/76 mmHg     Pulse Rate 01/23/16 1321 60     Resp 01/23/16 1321 18     Temp 01/23/16 1321 98.3 F (36.8 C)     Temp Source 01/23/16 1321 Oral     SpO2 01/23/16 1321 99 %     Weight 01/23/16 1321 175 lb (79.379 kg)     Height 01/23/16 1321  (1.803 m)     Head Cir --      Peak Flow --      Pain Score 01/23/16 1324 0     Pain Loc --      Pain Edu? --      Excl. in GC? --      Constitutional: Alert and oriented. Well appearing and in no acute distress. Eyes: Conjunctivae are normal.  Head: Atraumatic. Neck: No stridor. Supple with full range of motion. Hematological/Lymphatic/Immunilogical: No cervical  lymphadenopathy. Cardiovascular:  Good peripheral circulation. Respiratory: Normal respiratory effort without tachypnea or retractions.  Neurologic:  Normal speech and language. No gross focal neurologic deficits are appreciated.  Skin:  Hyperpigmented irregular, flat, dry rashes sporadically noted about the hairline, anterior scalp, right side of the neck. No pain to palpation of these areas. No induration or fluctuance is noted. No oozing or weeping. No ulcerations. Skin is warm, dry and intact.  Psychiatric: Mood and affect are normal. Speech and behavior are normal. Patient exhibits appropriate insight and judgement.   ____________________________________________   LABS  None  ____________________________________________  EKG  None ____________________________________________  RADIOLOGY  None ____________________________________________    PROCEDURES  Procedure(s) performed: None    Medications - No data to display   ____________________________________________   INITIAL IMPRESSION / ASSESSMENT AND PLAN / ED COURSE  Patient's diagnosis is consistent with tinea versicolor. Patient will be discharged home with prescriptions for fluconazole, hydroxyzine, to consult cream to use as directed. Patient is to follow up with [facility] if symptoms persist past this treatment course. Patient is given ED precautions to return to the ED for any worsening or new symptoms.    ____________________________________________  FINAL CLINICAL IMPRESSION(S) / ED DIAGNOSES  Final diagnoses:  Tinea versicolor      NEW MEDICATIONS STARTED DURING THIS VISIT:  Discharge Medication List as of 01/23/2016  2:42 PM    START taking these medications   Details  fluconazole (DIFLUCAN) 100 MG tablet Take 1 tablet (100 mg total) by mouth once a week. for 2 weeks, Starting 01/23/2016, Until Sun 02/06/16, Print    hydrOXYzine (VISTARIL) 25 MG capsule Take 1 capsule (25 mg total)  by mouth 3 (three) times daily as needed., Starting 01/23/2016, Until Discontinued, Print    ketoconazole (NIZORAL) 2 % cream Apply 1 application topically daily., Starting 01/23/2016, Until Discontinued, Print             Hope Pigeon, PA-C 01/23/16 1541  Myrna Blazer, MD 01/23/16 [id number]

## 2016-01-23 NOTE — ED Notes (Signed)
Pt was on Amoxicillin for tooth abscess. A week after stopping the medication a rash developed.  Patient has had the rash off and on prior to using Amoxicillin but states this time was worse. Pt c/o itching and burns.  Face burns and itches the most at this time.  Pt has been to dermatologist in the past for rash.

## 2016-01-23 NOTE — ED Notes (Signed)
NAD noted at time of D/C. Pt denies questions or concerns. Pt ambulatory to the lobby at this time.  

## 2016-08-04 ENCOUNTER — Emergency Department
Admission: EM | Admit: 2016-08-04 | Discharge: 2016-08-04 | Disposition: A | Discharge status: Home / Self Care | Payer: BLUE CROSS/BLUE SHIELD | Attending: Emergency Medicine | Admitting: Emergency Medicine

## 2016-08-04 ENCOUNTER — Encounter: Payer: Self-pay | Admitting: Emergency Medicine

## 2016-08-04 DIAGNOSIS — F17210 Nicotine dependence, cigarettes, uncomplicated: Secondary | ICD-10-CM | POA: Insufficient documentation

## 2016-08-04 DIAGNOSIS — K120 Recurrent oral aphthae: Secondary | ICD-10-CM

## 2016-08-04 MED ORDER — CARBAMIDE PEROXIDE 20 % MT SOLN: 1.0000 "application " | Freq: Four times a day (QID) | OROMUCOSAL | 0 refills | Status: DC | PRN

## 2016-08-04 NOTE — ED Provider Notes (Signed)
[facility] Emergency Department Provider Note ____________________________________________  Time seen: Approximately 3:16 PM  I have reviewed the triage vital signs and the nursing notes.   HISTORY  Chief Complaint Dental Pain   HPI Camry Lovett Calenderntonio Cressey is a 40 y.o. male who presents to the ER for evaluation of a lesion on his gum. He states that it appeared about 4 days ago and has not gotten any better. He denies pain but complains of "irritation." He has not taken any medication.  Past Medical History:  Diagnosis Date  . Blood clot in abdominal vein     There are no active problems to display for this patient.   Past Surgical History:  Procedure Laterality Date  . blood clot removal from abdomen      Prior to Admission medications   Medication Sig Start Date End Date Taking? Authorizing Provider  amoxicillin (AMOXIL) 500 MG tablet Take 1 tablet (500 mg total) by mouth 3 (three) times daily. 12/27/15   Charmayne Sheerharles M Beers, PA-C  Carbamide Peroxide 20 % SOLN Use as directed 1 application in the mouth or throat 4 (four) times daily as needed. 08/04/16   Chinita Pesterari B Ravin Bendall, FNP  HYDROcodone-acetaminophen (NORCO) 5-325 MG tablet Take 1-2 tablets by mouth every 4 (four) hours as needed for moderate pain. 12/27/15   Evangeline Dakinharles M Beers, PA-C  hydrOXYzine (VISTARIL) 25 MG capsule Take 1 capsule (25 mg total) by mouth 3 (three) times daily as needed. 01/23/16   Jami L Hagler, PA-C  ketoconazole (NIZORAL) 2 % cream Apply 1 application topically daily. 01/23/16   Jami L Hagler, PA-C  lidocaine (XYLOCAINE) 2 % solution Use as directed 20 mLs in the mouth or throat as needed for mouth pain. 12/27/15   Evangeline Dakinharles M Beers, PA-C    Allergies Tylenol [acetaminophen]  No family history on file.  Social History Social History  Substance Use Topics  . Smoking status: Current Every Day Smoker    Packs/day: 0.50    Types: Cigarettes  . Smokeless tobacco: Never Used  .  Alcohol use No    Review of Systems Constitutional: Well appearing.  ENT: Positive for lesion of the gum Musculoskeletal: Negative for jaw pain/trismus. Skin: negative for rash or wound ____________________________________________   PHYSICAL EXAM:  VITAL SIGNS: 125/97, heart rate 87, temperature 98.7, respiratory rate 20, SPO2 100%. ED Triage Vitals  Enc Vitals Group     BP      Pulse      Resp      Temp      Temp src      SpO2      Weight      Height      Head Circumference      Peak Flow      Pain Score      Pain Loc      Pain Edu?      Excl. in GC?     Constitutional: Alert and oriented. Well appearing and in no acute distress. Eyes: Conjunctivae are normal.EOMI. Mouth/Throat: Mucous membranes are moist. Oropharynx non-erythematous. Single, annular lesion noted to the gingival surface over the left incisor. There is no induration, fluctuance, or drainage associated with lesion. Periodontal Exam Hematological/Lymphatic/Immunilogical: No cervical lymphadenopathy. Respiratory: Normal respiratory effort.  Musculoskeletal: Full ROM x 4 extremities. Neurologic:  Normal speech and language. No gross focal neurologic deficits are appreciated. Speech is normal. No gait instability. Skin:  See above Psychiatric: Mood and affect are normal. Speech and behavior are normal.  ____________________________________________   LABS (all labs ordered are listed, but only abnormal results are displayed)  Labs Reviewed - No data to display ____________________________________________   RADIOLOGY  Not indicated ____________________________________________   PROCEDURES  Procedure(s) performed: None  Critical Care performed: No  ____________________________________________   INITIAL IMPRESSION / ASSESSMENT AND PLAN / ED COURSE  Pertinent labs & imaging results that were available during my care of the patient were reviewed by me and considered in my medical decision  making (see chart for details). Patient will be prescribed benzocaine. Patient was advised to see the dentist for symptoms aren't improving over the next few days.  Instructed to return to the ER for symptoms that change or worsen if unable to schedule an appointment. ____________________________________________   FINAL CLINICAL IMPRESSION(S) / ED DIAGNOSES  Final diagnoses:  Aphthous stomatitis    Note:  This document was prepared using Dragon voice recognition software and may include unintentional dictation errors.    Chinita PesterCari B Zarion Oliff, FNP 08/04/16 1719    Sharyn CreamerMark Quale, MD 08/05/16 [id number]

## 2016-08-04 NOTE — ED Triage Notes (Signed)
States he feels like he had something stuff in gum couple of days ago  Feels irritated

## 2016-08-13 ENCOUNTER — Encounter: Payer: Self-pay | Admitting: Emergency Medicine

## 2016-08-13 ENCOUNTER — Emergency Department
Admission: EM | Admit: 2016-08-13 | Discharge: 2016-08-13 | Disposition: A | Discharge status: Home / Self Care | Payer: BLUE CROSS/BLUE SHIELD | Attending: Student in an Organized Health Care Education/Training Program | Admitting: Student in an Organized Health Care Education/Training Program

## 2016-08-13 DIAGNOSIS — F17210 Nicotine dependence, cigarettes, uncomplicated: Secondary | ICD-10-CM | POA: Insufficient documentation

## 2016-08-13 DIAGNOSIS — K029 Dental caries, unspecified: Secondary | ICD-10-CM

## 2016-08-13 DIAGNOSIS — K0889 Other specified disorders of teeth and supporting structures: Secondary | ICD-10-CM

## 2016-08-13 DIAGNOSIS — K047 Periapical abscess without sinus: Secondary | ICD-10-CM | POA: Insufficient documentation

## 2016-08-13 DIAGNOSIS — Z79899 Other long term (current) drug therapy: Secondary | ICD-10-CM | POA: Insufficient documentation

## 2016-08-13 MED ORDER — TRAMADOL HCL 50 MG PO TABS: 50.0000 mg | ORAL_TABLET | Freq: Four times a day (QID) | ORAL | 0 refills | Status: DC | PRN

## 2016-08-13 MED ORDER — AMOXICILLIN-POT CLAVULANATE 875-125 MG PO TABS: 1.0000 | ORAL_TABLET | Freq: Two times a day (BID) | ORAL | 0 refills | Status: AC

## 2016-08-13 MED ORDER — IBUPROFEN 800 MG PO TABS: 800.0000 mg | ORAL_TABLET | Freq: Three times a day (TID) | ORAL | 0 refills | Status: DC | PRN

## 2016-08-13 NOTE — ED Triage Notes (Signed)
Pt c/o r upper tooth pain. Seen for same March 2017, states he has been unable to follow-up with dentist d/t work.

## 2016-08-13 NOTE — ED Notes (Signed)

## 2016-08-13 NOTE — Discharge Instructions (Signed)
Please take medications as prescribed. Please call dental clinic to schedule follow-up appointment. Return to the ER for any fevers difficulty swallowing worsening symptoms urgent changes in her health.

## 2016-08-13 NOTE — ED Provider Notes (Signed)
[facility]-EMERGENCY DEPARTMENT Provider Note   CSN: [id number] Arrival date & time: 08/13/16  [id number]     History   Chief Complaint Chief Complaint  Patient presents with  . Dental Pain    HPI Anthony Levine is a 40 y.o. male presents to the emergency department for evaluation of right upper tooth pain. Patient states he was supposed to have the tooth pulled years ago, has not had it pulled. Had a infection back in March that responded well to oral antibiotics. Over the last few days he's had increased pain that he describes as pressure and throbbing sensation with no drainage or fevers. He denies any trauma or injury. He is currently not taking any medications for pain. He denies any headaches. He tolerates by mouth well.  HPI  Past Medical History:  Diagnosis Date  . Blood clot in abdominal vein     There are no active problems to display for this patient.   Past Surgical History:  Procedure Laterality Date  . blood clot removal from abdomen         Home Medications    Prior to Admission medications   Medication Sig Start Date End Date Taking? Authorizing Provider  amoxicillin-clavulanate (AUGMENTIN) 875-125 MG tablet Take 1 tablet by mouth every 12 (twelve) hours. 08/13/16 08/23/16  Evon Slackhomas C Dillon Mcreynolds, PA-C  Carbamide Peroxide 20 % SOLN Use as directed 1 application in the mouth or throat 4 (four) times daily as needed. 08/04/16   Chinita Pesterari B Triplett, FNP  HYDROcodone-acetaminophen (NORCO) 5-325 MG tablet Take 1-2 tablets by mouth every 4 (four) hours as needed for moderate pain. 12/27/15   Evangeline Dakinharles M Beers, PA-C  hydrOXYzine (VISTARIL) 25 MG capsule Take 1 capsule (25 mg total) by mouth 3 (three) times daily as needed. 01/23/16   Jami L Hagler, PA-C  ibuprofen (ADVIL,MOTRIN) 800 MG tablet Take 1 tablet (800 mg total) by mouth every 8 (eight) hours as needed. 08/13/16   Evon Slackhomas C Lateasha Breuer, PA-C  ketoconazole (NIZORAL) 2 % cream Apply 1 application topically daily. 01/23/16    Jami L Hagler, PA-C  lidocaine (XYLOCAINE) 2 % solution Use as directed 20 mLs in the mouth or throat as needed for mouth pain. 12/27/15   Charmayne Sheerharles M Beers, PA-C  traMADol (ULTRAM) 50 MG tablet Take 1 tablet (50 mg total) by mouth every 6 (six) hours as needed. 08/13/16   Evon Slackhomas C Wynne Rozak, PA-C    Family History History reviewed. No pertinent family history.  Social History Social History  Substance Use Topics  . Smoking status: Current Some Day Smoker    Packs/day: 0.10    Types: Cigarettes  . Smokeless tobacco: Never Used  . Alcohol use No     Allergies   Tylenol [acetaminophen]   Review of Systems Review of Systems  Constitutional: Negative.  Negative for chills and fever.  HENT: Positive for dental problem. Negative for drooling, facial swelling, mouth sores, trouble swallowing and voice change.   Respiratory: Negative for chest tightness and shortness of breath.   Cardiovascular: Negative for chest pain.  Gastrointestinal: Negative for abdominal pain, diarrhea, nausea and vomiting.  Musculoskeletal: Negative for arthralgias, neck pain and neck stiffness.  Skin: Negative.   Psychiatric/Behavioral: Negative for confusion.  All other systems reviewed and are negative.    Physical Exam Updated Vital Signs BP (!) 142/69 (BP Location: Right Arm)   Pulse 61   Temp 97.9 F (36.6 C) (Oral)   Resp 15   Ht 5\' 11"  (1.803 m)  Wt 79.4 kg   SpO2 100%   BMI 24.41 kg/m   Physical Exam  Constitutional: He appears well-developed and well-nourished.  HENT:  Head: Normocephalic and atraumatic.  Mouth/Throat: Oropharynx is clear and moist. No oral lesions. No trismus in the jaw. Dental caries present. No dental abscesses or uvula swelling. No oropharyngeal exudate, posterior oropharyngeal edema, posterior oropharyngeal erythema or tonsillar abscesses.    Eyes: Conjunctivae are normal.  Neck: Neck supple.  Cardiovascular: Normal rate and regular rhythm.   No murmur  heard. Pulmonary/Chest: Effort normal and breath sounds normal. No respiratory distress.  Abdominal: Soft. There is no tenderness.  Musculoskeletal: He exhibits no edema.  Neurological: He is alert.  Skin: Skin is warm and dry. No rash noted. No erythema.  Psychiatric: He has a normal mood and affect.  Nursing note and vitals reviewed.    ED Treatments / Results  Labs (all labs ordered are listed, but only abnormal results are displayed) Labs Reviewed - No data to display  EKG  EKG Interpretation None       Radiology No results found.  Procedures Procedures (including critical care time)  Medications Ordered in ED Medications - No data to display   Initial Impression / Assessment and Plan / ED Course  I have reviewed the triage vital signs and the nursing notes.  Pertinent labs & imaging results that were available during my care of the patient were reviewed by me and considered in my medical decision making (see chart for details).  Clinical Course     40 year old male with ental pain, dental caries. He is placed on Augmentin, ibuprofen and tramadol. He is given dental clinic handout with information on several dental clinics. He is educated on signs and symptoms return to the emergency department for.  Final Clinical Impressions(s) / ED Diagnoses   Final diagnoses:  Dental caries  Toothache  Dental infection    New Prescriptions New Prescriptions   AMOXICILLIN-CLAVULANATE (AUGMENTIN) 875-125 MG TABLET    Take 1 tablet by mouth every 12 (twelve) hours.   IBUPROFEN (ADVIL,MOTRIN) 800 MG TABLET    Take 1 tablet (800 mg total) by mouth every 8 (eight) hours as needed.   TRAMADOL (ULTRAM) 50 MG TABLET    Take 1 tablet (50 mg total) by mouth every 6 (six) hours as needed.     Evon Slackhomas C Gerritt Galentine, PA-C 08/13/16 [id number]    Willy EddyPatrick Robinson, MD 08/13/16 1430

## 2016-12-12 ENCOUNTER — Emergency Department
Admission: EM | Admit: 2016-12-12 | Discharge: 2016-12-12 | Disposition: A | Discharge status: Home / Self Care | Payer: BLUE CROSS/BLUE SHIELD | Attending: Emergency Medicine | Admitting: Emergency Medicine

## 2016-12-12 ENCOUNTER — Encounter: Payer: Self-pay | Admitting: Emergency Medicine

## 2016-12-12 DIAGNOSIS — F17210 Nicotine dependence, cigarettes, uncomplicated: Secondary | ICD-10-CM | POA: Insufficient documentation

## 2016-12-12 DIAGNOSIS — Z202 Contact with and (suspected) exposure to infections with a predominantly sexual mode of transmission: Secondary | ICD-10-CM | POA: Insufficient documentation

## 2016-12-12 LAB — CHLAMYDIA/NGC RT PCR (ARMC ONLY)
Chlamydia Tr: NOT DETECTED
N GONORRHOEAE: NOT DETECTED

## 2016-12-12 MED ORDER — HYDROCORTISONE 0.5 % EX CREA: 1.0000 "application " | TOPICAL_CREAM | Freq: Two times a day (BID) | CUTANEOUS | 0 refills | Status: AC

## 2016-12-12 NOTE — ED Notes (Signed)
See triage note   States he wants to be tested for STD  Girlfriend was seen yesterday for same  Denies any urinary sx's or penile discharge  Also would like a refill meds for his rash

## 2016-12-12 NOTE — ED Notes (Signed)

## 2016-12-12 NOTE — ED Provider Notes (Signed)
[facility] Emergency Department Provider Note  ____________________________________________   I have reviewed the triage vital signs and the nursing notes.   HISTORY  Chief Complaint Rash    HPI Anthony Levine is a 41 y.o. male who presents today complaining of concern that his girlfriend was diagnosed with BV and he wants to be tested for everything. He is aware that bacterial vaginosis is not an STI but she was also tested for gonorrhea and chlamydia and he would like to be tested for it as well. He understands that her results are not yet back. He has no symptoms of STI. He's had no penile discharge or dysuria. The patient was also seen last year for a lesion on his penis which was consistent with a small folliculitis. It got better with Keflex. It was not thought at that time to be a chancroid. He has no known history of a ongoing STI infection. He is concerned that that area might get inflamed again and since he heard about his girlfriend's BV he would like to to have some steroid cream to put on there because he is worried that it will get inflamed again.    Past Medical History:  Diagnosis Date  . Blood clot in abdominal vein     There are no active problems to display for this patient.   Past Surgical History:  Procedure Laterality Date  . blood clot removal from abdomen      Prior to Admission medications   Medication Sig Start Date End Date Taking? Authorizing Provider  Carbamide Peroxide 20 % SOLN Use as directed 1 application in the mouth or throat 4 (four) times daily as needed. 08/04/16   Chinita Pesterari B Triplett, FNP  HYDROcodone-acetaminophen (NORCO) 5-325 MG tablet Take 1-2 tablets by mouth every 4 (four) hours as needed for moderate pain. 12/27/15   Evangeline Dakinharles M Beers, PA-C  hydrOXYzine (VISTARIL) 25 MG capsule Take 1 capsule (25 mg total) by mouth 3 (three) times daily as needed. 01/23/16   Jami L Hagler, PA-C  ibuprofen (ADVIL,MOTRIN) 800  MG tablet Take 1 tablet (800 mg total) by mouth every 8 (eight) hours as needed. 08/13/16   Evon Slackhomas C Gaines, PA-C  ketoconazole (NIZORAL) 2 % cream Apply 1 application topically daily. 01/23/16   Jami L Hagler, PA-C  lidocaine (XYLOCAINE) 2 % solution Use as directed 20 mLs in the mouth or throat as needed for mouth pain. 12/27/15   Charmayne Sheerharles M Beers, PA-C  traMADol (ULTRAM) 50 MG tablet Take 1 tablet (50 mg total) by mouth every 6 (six) hours as needed. 08/13/16   Evon Slackhomas C Gaines, PA-C    Allergies Tylenol [acetaminophen]  History reviewed. No pertinent family history.  Social History Social History  Substance Use Topics  . Smoking status: Current Some Day Smoker    Packs/day: 0.10    Types: Cigarettes  . Smokeless tobacco: Never Used  . Alcohol use No    Review of Systems Constitutional: No fever/chills Eyes: No visual changes. ENT: No sore throat. No stiff neck no neck pain Cardiovascular: Denies chest pain. Respiratory: Denies shortness of breath. Gastrointestinal:   no vomiting.  No diarrhea.  No constipation. Genitourinary: Negative for dysuria. Musculoskeletal: Negative lower extremity swelling Skin: Negative for rash. Neurological: Negative for severe headaches, focal weakness or numbness. 10-point ROS otherwise negative.  ____________________________________________   PHYSICAL EXAM:  VITAL SIGNS: ED Triage Vitals [12/12/16 0959]  Enc Vitals Group     BP (!) 160/103  Pulse Rate 79     Resp 14     Temp 98.3 F (36.8 C)     Temp Source Oral     SpO2 100 %     Weight 175 lb (79.4 kg)     Height 5\' 11"  (1.803 m)     Head Circumference      Peak Flow      Pain Score      Pain Loc      Pain Edu?      Excl. in GC?     Constitutional: Alert and oriented. Well appearing and in no acute distress. Mouth/Throat: Mucous membranes are moist.  Oropharynx non-erythematous. Abdominal: Soft and nontender. No distention. No guarding no rebound Normal external  genitalia there is a small area that appears to be a scar from prior infection on the dorsum of the penis proximal to the glans. It is not tender or erythematous it is not an erosion there is no evidence of a chancre or chancroid, note penile lesions otherwise there is no testicular pain or swelling. Masses noted no discharge no herpetic lesions. No lymphadenopathy noted.  Skin:  Skin is warm, dry and intact. No rash noted. Psychiatric: Mood and affect are anxious. Speech and behavior are normal.  ____________________________________________   LABS (all labs ordered are listed, but only abnormal results are displayed)  Labs Reviewed  CHLAMYDIA/NGC RT PCR ([facility] ONLY)  RPR  HIV ANTIBODY (ROUTINE TESTING)   ____________________________________________  EKG  I personally interpreted any EKGs ordered by me or triage  ____________________________________________  RADIOLOGY  I reviewed any imaging ordered by me or triage that were performed during my shift and, if possible, patient and/or family made aware of any abnormal findings. ____________________________________________   PROCEDURES  Procedure(s) performed: None  Procedures  Critical Care performed: None  ____________________________________________   INITIAL IMPRESSION / ASSESSMENT AND PLAN / ED COURSE  Pertinent labs & imaging results that were available during my care of the patient were reviewed by me and considered in my medical decision making (see chart for details).  Condition essentially here for STI testing because his girlfriend had BV. The area on his penis does not appear to be actively inflamed although at one point there was what is described in prior notes is a small pustule there. I will send RPR is a precaution as well as HIV and we will also check the patient for gonorrhea Chlamydia in his urine at his request although at this time low suspicion. Patient would like a steroid cream as he is  convinced that this will help his area of small scar tissue in the back of his penis getting inflamed again. I'll give him a low dose of hydrocortisone as I don't think it will hurt him and it may help ease his mind. If there is some incipient inflammation, from sexual encounters which he v describes, it may help as well. Return precautions and follow-up given and understood.    ____________________________________________   FINAL CLINICAL IMPRESSION(S) / ED DIAGNOSES  Final diagnoses:  None      This chart was dictated using voice recognition software.  Despite best efforts to proofread,  errors can occur which can change meaning.      Jeanmarie Plant, MD 12/12/16 1056

## 2016-12-12 NOTE — ED Triage Notes (Signed)
Rash  In groin area.  Using antifungals otc without releif.  Now wants std tests due to girlfriend seen here and dx bact vaginitis

## 2016-12-13 LAB — HIV ANTIBODY (ROUTINE TESTING W REFLEX): HIV Screen 4th Generation wRfx: NONREACTIVE

## 2016-12-13 LAB — RPR: RPR Ser Ql: NONREACTIVE

## 2016-12-25 DIAGNOSIS — M25571 Pain in right ankle and joints of right foot: Secondary | ICD-10-CM | POA: Insufficient documentation

## 2016-12-25 DIAGNOSIS — F17210 Nicotine dependence, cigarettes, uncomplicated: Secondary | ICD-10-CM | POA: Insufficient documentation

## 2016-12-25 DIAGNOSIS — Z5321 Procedure and treatment not carried out due to patient leaving prior to being seen by health care provider: Secondary | ICD-10-CM | POA: Insufficient documentation

## 2016-12-26 ENCOUNTER — Encounter: Payer: Self-pay | Admitting: *Deleted

## 2016-12-26 ENCOUNTER — Telehealth: Payer: Self-pay | Admitting: Emergency Medicine

## 2016-12-26 ENCOUNTER — Emergency Department
Admission: EM | Admit: 2016-12-26 | Discharge: 2016-12-26 | Disposition: A | Discharge status: Home / Self Care | Payer: BLUE CROSS/BLUE SHIELD | Attending: Emergency Medicine | Admitting: Emergency Medicine

## 2016-12-26 ENCOUNTER — Emergency Department: Payer: BLUE CROSS/BLUE SHIELD

## 2016-12-26 DIAGNOSIS — S8251XA Displaced fracture of medial malleolus of right tibia, initial encounter for closed fracture: Secondary | ICD-10-CM | POA: Insufficient documentation

## 2016-12-26 DIAGNOSIS — F17210 Nicotine dependence, cigarettes, uncomplicated: Secondary | ICD-10-CM | POA: Insufficient documentation

## 2016-12-26 DIAGNOSIS — Y999 Unspecified external cause status: Secondary | ICD-10-CM | POA: Insufficient documentation

## 2016-12-26 DIAGNOSIS — M25571 Pain in right ankle and joints of right foot: Secondary | ICD-10-CM

## 2016-12-26 DIAGNOSIS — Y9355 Activity, bike riding: Secondary | ICD-10-CM | POA: Insufficient documentation

## 2016-12-26 DIAGNOSIS — Y92410 Unspecified street and highway as the place of occurrence of the external cause: Secondary | ICD-10-CM | POA: Insufficient documentation

## 2016-12-26 MED ORDER — TRAMADOL HCL 50 MG PO TABS: 50.0000 mg | ORAL_TABLET | Freq: Four times a day (QID) | ORAL | 0 refills | Status: DC | PRN

## 2016-12-26 MED ORDER — HYDROCODONE-ACETAMINOPHEN 5-325 MG PO TABS: 1.0000 | ORAL_TABLET | Freq: Three times a day (TID) | ORAL | 0 refills | Status: DC | PRN
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
## 2016-12-26 NOTE — Telephone Encounter (Addendum)
Called patient due to lwot to inquire about condition and follow up plans. Also noted fracture on xray done as a protocol.  Left message asking him to call me as soon as possible.    0945--patient called back and agrees to return.
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
## 2016-12-26 NOTE — Discharge Instructions (Signed)
You have a break to the bone of the inside of the ankle joint. Wear the splint until you are evaluated by orthopedics. You should use the crutches to walk without putting weight on you foot/splint. Take the prescription meds as needed for pain.
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
## 2017-02-15 ENCOUNTER — Emergency Department
Admission: EM | Admit: 2017-02-15 | Discharge: 2017-02-15 | Disposition: A | Discharge status: Home / Self Care | Payer: BLUE CROSS/BLUE SHIELD | Attending: Emergency Medicine | Admitting: Emergency Medicine

## 2017-02-15 ENCOUNTER — Encounter: Payer: Self-pay | Admitting: Physician Assistant

## 2017-02-15 DIAGNOSIS — Z79899 Other long term (current) drug therapy: Secondary | ICD-10-CM | POA: Insufficient documentation

## 2017-02-15 DIAGNOSIS — A630 Anogenital (venereal) warts: Secondary | ICD-10-CM | POA: Insufficient documentation

## 2017-02-15 DIAGNOSIS — F17210 Nicotine dependence, cigarettes, uncomplicated: Secondary | ICD-10-CM | POA: Insufficient documentation

## 2017-02-15 MED ORDER — IMIQUIMOD 5 % EX CREA: TOPICAL_CREAM | CUTANEOUS | 1 refills | Status: AC

## 2017-02-15 NOTE — Discharge Instructions (Signed)
You may follow-up with the dermatologist for pathology report findings. As we determined, there may not be a specific "type" available for you. See the [facility] as needed for further testing.

## 2017-02-15 NOTE — ED Triage Notes (Signed)
Pt reports chronic rash to groin. Pt states he has been seen here, by PCP and dermatologist and just wants to make sure "it is what they say it is".

## 2017-02-15 NOTE — ED Notes (Addendum)
See triage note  States he has had a rash to groin area   Was seen here in march for same and states he f/u with a "skin" md  But area is still there and wants to be sure that  He does not for STD

## 2017-02-17 NOTE — ED Provider Notes (Signed)
[facility] Emergency Department Provider Note ____________________________________________  Time seen: 1313  I have reviewed the triage vital signs and the nursing notes.  HISTORY  Chief Complaint  Rash   HPI Anthony Levine is a 41 y.o. male resistance to the ED for evaluation of junk warts. Patient describes he was recently seen by dermatologist and had a biopsy of some genital warts. 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 Patient is concerned at this time for the particular type of HPV virus he has. He denies any new lesions, he is treated the lesions in the past with imiquimod. He also voiced some concern for his girlfriend, who is not showing any genital lesions. He denies any fevers, chills, sweats, vaginal discharge, or any other complaint at this time. Patient is essentially wondering if there is a way to type his HPV.  Past Medical History:  Diagnosis Date  . Blood clot in abdominal vein     There are no active problems to display for this patient.   Past Surgical History:  Procedure Laterality Date  . blood clot removal from abdomen      Prior to Admission medications   Medication Sig Start Date End Date Taking? Authorizing Provider  Carbamide Peroxide 20 % SOLN Use as directed 1 application in the mouth or throat 4 (four) times daily as needed. 08/04/16   Triplett, Rulon Eisenmenger B, FNP  HYDROcodone-acetaminophen (NORCO) 5-325 MG tablet Take 1 tablet by mouth 3 (three) times daily as needed. 12/26/16   Udell Blasingame, Charlesetta Ivory, PA-C  ibuprofen (ADVIL,MOTRIN) 800 MG tablet Take 1 tablet (800 mg total) by mouth every 8 (eight) hours as needed. 08/13/16   Evon Slack, PA-C  imiquimod Mathis Dad) 5 % cream Apply to affected area three times weekly 02/15/17 04/17/17  Keshayla Schrum, Charlesetta Ivory, PA-C  ketoconazole (NIZORAL) 2 % cream Apply 1 application topically daily. 01/23/16   Hagler, Jami L, PA-C  lidocaine (XYLOCAINE) 2 % solution Use  as directed 20 mLs in the mouth or throat as needed for mouth pain. 12/27/15   Beers, Charmayne Sheer, PA-C    Allergies Tylenol [acetaminophen]  No family history on file.  Social History Social History  Substance Use Topics  . Smoking status: Current Some Day Smoker    Packs/day: 0.10    Types: Cigarettes  . Smokeless tobacco: Never Used  . Alcohol use No    Review of Systems  Constitutional: Negative for fever. Cardiovascular: Negative for chest pain. Respiratory: Negative for shortness of breath. Gastrointestinal: Negative for abdominal pain, vomiting and diarrhea. Genitourinary: Negative for dysuria. HPV as above Musculoskeletal: Negative for back pain. Skin: Negative for rash. ____________________________________________  PHYSICAL EXAM:  VITAL SIGNS: ED Triage Vitals  Enc Vitals Group     BP 02/15/17 1248 (!) 166/89     Pulse Rate 02/15/17 1248 (!) 102     Resp 02/15/17 1248 18     Temp 02/15/17 1248 98.1 F (36.7 C)     Temp Source 02/15/17 1248 Oral     SpO2 02/15/17 1248 94 %     Weight 02/15/17 1249 175 lb (79.4 kg)     Height 02/15/17 1249 5\' 11"  (1.803 m)     Head Circumference --      Peak Flow --      Pain Score --      Pain Loc --      Pain Edu? --      Excl. in GC? --  Constitutional: Alert and oriented. Well appearing and in no distress. Head: Normocephalic and atraumatic. Respiratory: Normal respiratory effort.  GU: deferred. Musculoskeletal: Nontender with normal range of motion in all extremities.  Neurologic:  Normal gait without ataxia. Normal speech and language. No gross focal neurologic deficits are appreciated. Psychiatric: Mood and affect are normal. Patient exhibits appropriate insight and judgment. ____________________________________________  INITIAL IMPRESSION / ASSESSMENT AND PLAN / ED COURSE  Patient with an STD consultation on this [place]. He is given obtained to ask questions and information is provided that there is no  particular way to type HPV in males at this time. He will follow-up with [place], health Department or dermatologist as needed for symptom management. Prescription for imiquimod is provided at this time. ____________________________________________  FINAL CLINICAL IMPRESSION(S) / ED DIAGNOSES  Final diagnoses:  Genital warts      Karmen StabsMenshew, Charlesetta IvoryJenise V Bacon, PA-C 02/17/17 0035    Jene EveryKinner, Robert, MD 02/19/17 [phone]

## 2017-03-20 ENCOUNTER — Emergency Department
Admission: EM | Admit: 2017-03-20 | Discharge: 2017-03-20 | Disposition: A | Discharge status: Home / Self Care | Payer: BLUE CROSS/BLUE SHIELD | Attending: Emergency Medicine | Admitting: Emergency Medicine

## 2017-03-20 DIAGNOSIS — S838X2A Sprain of other specified parts of left knee, initial encounter: Secondary | ICD-10-CM

## 2017-03-20 DIAGNOSIS — Y998 Other external cause status: Secondary | ICD-10-CM | POA: Insufficient documentation

## 2017-03-20 DIAGNOSIS — Y9355 Activity, bike riding: Secondary | ICD-10-CM | POA: Insufficient documentation

## 2017-03-20 DIAGNOSIS — Y929 Unspecified place or not applicable: Secondary | ICD-10-CM | POA: Insufficient documentation

## 2017-03-20 DIAGNOSIS — S8392XA Sprain of unspecified site of left knee, initial encounter: Secondary | ICD-10-CM | POA: Insufficient documentation

## 2017-03-20 DIAGNOSIS — M25461 Effusion, right knee: Secondary | ICD-10-CM | POA: Insufficient documentation

## 2017-03-20 MED ORDER — PREDNISONE 20 MG PO TABS
60.0000 mg | ORAL_TABLET | Freq: Once | ORAL | Status: AC
  Administered 2017-03-20: 60 mg via ORAL

## 2017-03-20 MED ORDER — PREDNISONE 10 MG (21) PO TBPK: ORAL_TABLET | ORAL | 0 refills | Status: DC
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
## 2017-05-14 ENCOUNTER — Encounter: Payer: Self-pay | Admitting: *Deleted

## 2017-05-14 ENCOUNTER — Emergency Department
Admission: EM | Admit: 2017-05-14 | Discharge: 2017-05-14 | Disposition: A | Discharge status: Home / Self Care | Payer: BLUE CROSS/BLUE SHIELD | Attending: Emergency Medicine | Admitting: Emergency Medicine

## 2017-05-14 DIAGNOSIS — F17210 Nicotine dependence, cigarettes, uncomplicated: Secondary | ICD-10-CM | POA: Insufficient documentation

## 2017-05-14 DIAGNOSIS — Z79899 Other long term (current) drug therapy: Secondary | ICD-10-CM | POA: Insufficient documentation

## 2017-05-14 DIAGNOSIS — K047 Periapical abscess without sinus: Secondary | ICD-10-CM

## 2017-05-14 DIAGNOSIS — K029 Dental caries, unspecified: Secondary | ICD-10-CM

## 2017-05-14 MED ORDER — PENICILLIN V POTASSIUM 500 MG PO TABS
500.0000 mg | ORAL_TABLET | Freq: Once | ORAL | Status: AC
  Administered 2017-05-14: 500 mg via ORAL
  Filled 2017-05-14: qty 1

## 2017-05-14 MED ORDER — PENICILLIN V POTASSIUM 500 MG PO TABS: 500.0000 mg | ORAL_TABLET | Freq: Four times a day (QID) | ORAL | 0 refills | Status: DC

## 2017-05-14 NOTE — ED Triage Notes (Signed)
Pt has right upper toothache or 2 days.  Taking advil without relief.  Pt alert.

## 2017-05-14 NOTE — Discharge Instructions (Signed)
Take the antibiotic as directed. Follow-up with your dental provider as scheduled.

## 2017-05-14 NOTE — ED Notes (Signed)
Reviewed d/c instructions, follow-up care and prescription with patient. Pt verbalized understanding.  

## 2017-05-15 ENCOUNTER — Encounter: Payer: Self-pay | Admitting: Emergency Medicine

## 2017-05-15 DIAGNOSIS — L0291 Cutaneous abscess, unspecified: Secondary | ICD-10-CM | POA: Insufficient documentation

## 2017-05-15 NOTE — ED Triage Notes (Signed)
Patient ambulatory to triage with steady gait, without difficulty or distress noted; pt reports abscess to inside right cheek tonight

## 2017-05-16 ENCOUNTER — Emergency Department
Admission: EM | Admit: 2017-05-16 | Discharge: 2017-05-16 | Discharge status: Against Medical Advice | Payer: Self-pay | Attending: Emergency Medicine | Admitting: Emergency Medicine

## 2017-05-16 ENCOUNTER — Encounter: Payer: Self-pay | Admitting: Emergency Medicine

## 2017-05-16 ENCOUNTER — Emergency Department
Admission: EM | Admit: 2017-05-16 | Discharge: 2017-05-16 | Disposition: A | Discharge status: Home / Self Care | Payer: Self-pay | Attending: Student in an Organized Health Care Education/Training Program | Admitting: Student in an Organized Health Care Education/Training Program

## 2017-05-16 ENCOUNTER — Emergency Department
Admission: EM | Admit: 2017-05-16 | Discharge: 2017-05-16 | Disposition: A | Discharge status: Home / Self Care | Payer: Self-pay | Attending: Emergency Medicine | Admitting: Emergency Medicine

## 2017-05-16 ENCOUNTER — Encounter: Payer: Self-pay | Admitting: *Deleted

## 2017-05-16 DIAGNOSIS — K047 Periapical abscess without sinus: Secondary | ICD-10-CM | POA: Insufficient documentation

## 2017-05-16 DIAGNOSIS — F17210 Nicotine dependence, cigarettes, uncomplicated: Secondary | ICD-10-CM | POA: Insufficient documentation

## 2017-05-16 DIAGNOSIS — Z79899 Other long term (current) drug therapy: Secondary | ICD-10-CM | POA: Insufficient documentation

## 2017-05-16 NOTE — ED Notes (Signed)
See triage note  States he was seen 2 days ago for dental abscess  States this am noticed some swelling to right upper gumline and some bruising inside of left side of mouth  Also concerned for STD exposure  Requesting to be tested for same

## 2017-05-16 NOTE — Discharge Instructions (Signed)
Continue taking your penicillin until completely gone. Keep appointment with your dentist next week.

## 2017-05-16 NOTE — ED Provider Notes (Signed)
[facility] Emergency Department Provider Note   ____________________________________________   I have reviewed the triage vital signs and the nursing notes.   HISTORY  Chief Complaint Dental Pain   History limited by: Not Limited   HPI Anthony Levine is a 41 y.o. male who presents to the emergency department today because he would like a referral [facility]. Patient was seen earlier in the emergency department today and then earlier this week for concerns for dental issues. Patient is concerned that he has oral cancer. When he was seen earlier this week he was diagnosed with a dental abscess. He was given penicillin. He states he started the penicillin yesterday. He has felt a couple of bumps on the inside of his cheeks. In addition he has canker sore to the left cheek. He states that he has been trying to call dentist clinics and primary care clinics and he was told he needed to come to the emergency department for a referral to [facility].   Past Medical History:  Diagnosis Date  . Blood clot in abdominal vein     There are no active problems to display for this patient.   Past Surgical History:  Procedure Laterality Date  . blood clot removal from abdomen      Prior to Admission medications   Medication Sig Start Date End Date Taking? Authorizing Provider  Carbamide Peroxide 20 % SOLN Use as directed 1 application in the mouth or throat 4 (four) times daily as needed. 08/04/16   Triplett, Rulon Eisenmengerari B, FNP  HYDROcodone-acetaminophen (NORCO) 5-325 MG tablet Take 1 tablet by mouth 3 (three) times daily as needed. 12/26/16   Menshew, Charlesetta IvoryJenise V Bacon, PA-C  ibuprofen (ADVIL,MOTRIN) 800 MG tablet Take 1 tablet (800 mg total) by mouth every 8 (eight) hours as needed. 08/13/16   Evon SlackGaines, Thomas C, PA-C  ketoconazole (NIZORAL) 2 % cream Apply 1 application topically daily. 01/23/16   Hagler, Jami L, PA-C  lidocaine (XYLOCAINE) 2 % solution Use as  directed 20 mLs in the mouth or throat as needed for mouth pain. 12/27/15   Beers, Charmayne Sheerharles M, PA-C  penicillin v potassium (VEETID) 500 MG tablet Take 1 tablet (500 mg total) by mouth 4 (four) times daily. 05/14/17   Menshew, Charlesetta IvoryJenise V Bacon, PA-C  predniSONE (STERAPRED UNI-PAK 21 TAB) 10 MG (21) TBPK tablet Take 6 tablets on day 1. Take 5 tablets on day 2. Take 4 tablets on day 3. Take 3 tablets on day 4. Take 2 tablets on day 5. Take 1 tablets on day 6. 03/20/17   Little, Traci M, PA-C    Allergies Tylenol [acetaminophen]  History reviewed. No pertinent family history.  Social History Social History  Substance Use Topics  . Smoking status: Current Some Day Smoker    Packs/day: 0.10    Types: Cigarettes  . Smokeless tobacco: Never Used  . Alcohol use No    Review of Systems Constitutional: No fever/chills ENT: Positive for bumps on the inside of his check. Cardiovascular: Denies chest pain. Respiratory: Denies shortness of breath. Gastrointestinal: No abdominal pain.  No nausea, no vomiting.  No diarrhea.   Genitourinary: Negative for dysuria. Musculoskeletal: Negative for back pain. Skin: Negative for rash. Neurological: Negative for headaches, focal weakness or numbness.  ____________________________________________   PHYSICAL EXAM:  VITAL SIGNS: ED Triage Vitals  Enc Vitals Group     BP 05/16/17 1322 133/85     Pulse Rate 05/16/17 1322 67  Resp 05/16/17 1322 16     Temp 05/16/17 1322 98.2 F (36.8 C)     Temp Source 05/16/17 1322 Oral     SpO2 05/16/17 1322 99 %     Weight 05/16/17 1319 175 lb (79.4 kg)     Height 05/16/17 1319 5\' 11"  (1.803 m)     Head Circumference --      Peak Flow --      Pain Score 05/16/17 1318 6   Constitutional: Alert and oriented.  Eyes: Conjunctivae are normal.  ENT   Head: Normocephalic and atraumatic.   Nose: No congestion/rhinnorhea.   Mouth/Throat: Mucous membranes are moist. Aphthous ulcer to left check. No obvious  mass.    Neck: No stridor. Hematological/Lymphatic/Immunilogical: No cervical lymphadenopathy. Cardiovascular: Normal rate, regular rhythm.  No murmurs, rubs, or gallops.  Respiratory: Normal respiratory effort without tachypnea nor retractions. Breath sounds are clear and equal bilaterally. No wheezes/rales/rhonchi. Gastrointestinal: Soft and non tender. No rebound. No guarding.  Genitourinary: Deferred Musculoskeletal: Normal range of motion in all extremities.  Neurologic:  Normal speech and language. No gross focal neurologic deficits are appreciated.  Skin:  Skin is warm, dry and intact. No rash noted. Psychiatric: Slightly upset.  ____________________________________________    LABS (pertinent positives/negatives)  None  ____________________________________________   EKG  None  ____________________________________________    RADIOLOGY  None  ____________________________________________   PROCEDURES  Procedures  ____________________________________________   INITIAL IMPRESSION / ASSESSMENT AND PLAN / ED COURSE  Pertinent labs & imaging results that were available during my care of the patient were reviewed by me and considered in my medical decision making (see chart for details).  Patient presents to the emergency department today requesting referral to [facility] dentistry. Patient is concerned that he might have oral cancer. On exam I do not see any obvious evidence of oral cancer. 2 of the lesions that the patient was concerned about our salivary ducts. I did attempt to discuss this with the patient. In addition I discussed with patient that we do not have any direct length to [facility] dentistry. I did discuss with patient that we can certainly give him dental options and the area and primary care options. I discussed with patient that he could try to get a referral from primary care. Patient became upset. He states he needs a referral from the emergency department.  I discussed with patient that I could certainly right take dentistry on the discharge paperwork but did not have any direct line to them. He states that he will be back in the emergency department every day this week until he gets the paperwork he needs. Again I tried to encourage the patient to get a primary care physicians or primary care dental. Do not feel any further emergent evaluation or treatment necessary at this time.  ____________________________________________   FINAL CLINICAL IMPRESSION(S) / ED DIAGNOSES  Final diagnoses:  Dental infection     Note: This dictation was prepared with Dragon dictation. Any transcriptional errors that result from this process are unintentional     Phineas Semen, MD 05/16/17 1426

## 2017-05-16 NOTE — ED Notes (Signed)
Pt stormed out of room refusing to sign

## 2017-05-16 NOTE — ED Provider Notes (Signed)
[facility] Emergency Department Provider Note ____________________________________________  Time seen: 2104  I have reviewed the triage vital signs and the nursing notes.  HISTORY  Chief Complaint  Dental Pain  HPI Anthony Levine is a 41 y.o. male presents to the ED for evaluation of right upper molar toothache for 2 days. Patient is to take a nap that without significantly. He admits to some gum swelling without any spontaneous drainage. He admits that he has a dental appointment scheduled for the end of the week, but reports he needs to be on antibiotics prior to the evaluation. He denies any interim fevers, chills, or sweats.  Past Medical History:  Diagnosis Date  . Blood clot in abdominal vein     There are no active problems to display for this patient.   Past Surgical History:  Procedure Laterality Date  . blood clot removal from abdomen      Prior to Admission medications   Medication Sig Start Date End Date Taking? Authorizing Provider  Carbamide Peroxide 20 % SOLN Use as directed 1 application in the mouth or throat 4 (four) times daily as needed. 08/04/16   Triplett, Rulon Eisenmengerari B, FNP  HYDROcodone-acetaminophen (NORCO) 5-325 MG tablet Take 1 tablet by mouth 3 (three) times daily as needed. 12/26/16   Kortnee Bas, Charlesetta IvoryJenise V Bacon, PA-C  ibuprofen (ADVIL,MOTRIN) 800 MG tablet Take 1 tablet (800 mg total) by mouth every 8 (eight) hours as needed. 08/13/16   Evon SlackGaines, Thomas C, PA-C  ketoconazole (NIZORAL) 2 % cream Apply 1 application topically daily. 01/23/16   Hagler, Jami L, PA-C  lidocaine (XYLOCAINE) 2 % solution Use as directed 20 mLs in the mouth or throat as needed for mouth pain. 12/27/15   Beers, Charmayne Sheerharles M, PA-C  penicillin v potassium (VEETID) 500 MG tablet Take 1 tablet (500 mg total) by mouth 4 (four) times daily. 05/14/17   Ryana Montecalvo, Charlesetta IvoryJenise V Bacon, PA-C  predniSONE (STERAPRED UNI-PAK 21 TAB) 10 MG (21) TBPK tablet Take 6 tablets on day 1.  Take 5 tablets on day 2. Take 4 tablets on day 3. Take 3 tablets on day 4. Take 2 tablets on day 5. Take 1 tablets on day 6. 03/20/17   Little, Traci M, PA-C    Allergies Tylenol [acetaminophen]  No family history on file.  Social History Social History  Substance Use Topics  . Smoking status: Current Some Day Smoker    Packs/day: 0.10    Types: Cigarettes  . Smokeless tobacco: Never Used  . Alcohol use No    Review of Systems  Constitutional: Negative for fever. Eyes: Negative for visual changes. ENT: Negative for sore throat. Dental pain as above. Cardiovascular: Negative for chest pain. Respiratory: Negative for shortness of breath. Gastrointestinal: Negative for abdominal pain, vomiting and diarrhea. ____________________________________________  PHYSICAL EXAM:  VITAL SIGNS: ED Triage Vitals  Enc Vitals Group     BP 05/14/17 2013 139/89     Pulse Rate 05/14/17 2013 (!) 57     Resp 05/14/17 2013 20     Temp 05/14/17 2013 98.6 F (37 C)     Temp Source 05/14/17 2013 Oral     SpO2 05/14/17 2013 99 %     Weight 05/14/17 2014 180 lb (81.6 kg)     Height 05/14/17 2014 5\' 11"  (1.803 m)     Head Circumference --      Peak Flow --      Pain Score 05/14/17 2012 10     Pain  Loc --      Pain Edu? --      Excl. in GC? --     Constitutional: Alert and oriented. Well appearing and in no distress. Head: Normocephalic and atraumatic. Eyes: Conjunctivae are normal. Normal extraocular movements Mouth/Throat: Mucous membranes are moist. Patient with poor dentition. Upper 3rd molar chronically broken to the gumline. Local gum edema is noted. No sublingual brawny erythema noted. Tonsils are flat and uvula is midline.  Neck: Supple. No thyromegaly. Hematological/Lymphatic/Immunological: No cervical lymphadenopathy. Cardiovascular: Normal rate, regular rhythm. Normal distal pulses. Respiratory: Normal respiratory effort. No  wheezes/rales/rhonchi. ____________________________________________  PROCEDURES  Pen VK 500 mg PO ____________________________________________  INITIAL IMPRESSION / ASSESSMENT AND PLAN / ED COURSE  Patient with ED evaluation of dental abscess. He is placed on Pen VK for dental infection. He will follow-up with his dental provider as scheduled. Return precautions are provided.  ____________________________________________  FINAL CLINICAL IMPRESSION(S) / ED DIAGNOSES  Final diagnoses:  Pain due to dental caries  Dental abscess      Lissa Hoard, PA-C 05/16/17 0015    Sharman Cheek, MD 05/20/17 2023

## 2017-05-16 NOTE — Discharge Instructions (Signed)
Please seek medical attention for any high fevers, chest pain, shortness of breath, change in behavior, persistent vomiting, bloody stool or any other new or concerning symptoms.  

## 2017-05-16 NOTE — ED Notes (Addendum)
Pt left prior to signing for d/c paperwork. Pt stating that he needs a referral to [facility] for an [facility] screening. Nurse explained that he would need to speak to a primary doctor for referral. Pt stating that he has no insurance and wouldn't be able to set up a PCP. Nurse explained that there were programs that can help. Pt stating I will just come back everyday to the ED until you give me one. Pt left after nurse handing his paperwork to him with dental information. Pt in NAD on departure

## 2017-05-16 NOTE — ED Provider Notes (Signed)
[facility] Emergency Department Provider Note  ____________________________________________   First MD Initiated Contact with Patient 05/16/17 [phone]     (approximate)  I have reviewed the triage vital signs and the nursing notes.   HISTORY  Chief Complaint Dental Pain    HPI Anthony Levine is a 41 y.o. male is here with complaint of dental abscess.Patient was seen in this ED 2 days ago which time he was prescribed antibiotics. Today he states he was brushing his teeth and saw pus.  He immediately believed that he had an STD in his mouth. He canceled his dental appointment and states that he rescheduled for next week. He continues to take the Pen-Vee K as prescribed. He is unaware of any STD exposure but states that he had a workup done in the emergency room this year. Currently he denies any symptoms of STD such as penile discharge or sores. Currently rates his pain as 6/10.   Past Medical History:  Diagnosis Date  . Blood clot in abdominal vein     There are no active problems to display for this patient.   Past Surgical History:  Procedure Laterality Date  . blood clot removal from abdomen      Prior to Admission medications   Medication Sig Start Date End Date Taking? Authorizing Provider  Carbamide Peroxide 20 % SOLN Use as directed 1 application in the mouth or throat 4 (four) times daily as needed. 08/04/16   Triplett, Rulon Eisenmenger B, FNP  HYDROcodone-acetaminophen (NORCO) 5-325 MG tablet Take 1 tablet by mouth 3 (three) times daily as needed. 12/26/16   Menshew, Charlesetta Ivory, PA-C  ibuprofen (ADVIL,MOTRIN) 800 MG tablet Take 1 tablet (800 mg total) by mouth every 8 (eight) hours as needed. 08/13/16   Evon Slack, PA-C  ketoconazole (NIZORAL) 2 % cream Apply 1 application topically daily. 01/23/16   Hagler, Jami L, PA-C  lidocaine (XYLOCAINE) 2 % solution Use as directed 20 mLs in the mouth or throat as needed for mouth pain. 12/27/15    Beers, Charmayne Sheer, PA-C  penicillin v potassium (VEETID) 500 MG tablet Take 1 tablet (500 mg total) by mouth 4 (four) times daily. 05/14/17   Menshew, Charlesetta Ivory, PA-C  predniSONE (STERAPRED UNI-PAK 21 TAB) 10 MG (21) TBPK tablet Take 6 tablets on day 1. Take 5 tablets on day 2. Take 4 tablets on day 3. Take 3 tablets on day 4. Take 2 tablets on day 5. Take 1 tablets on day 6. 03/20/17   Little, Traci M, PA-C    Allergies Tylenol [acetaminophen]  No family history on file.  Social History Social History  Substance Use Topics  . Smoking status: Current Some Day Smoker    Packs/day: 0.10    Types: Cigarettes  . Smokeless tobacco: Never Used  . Alcohol use No    Review of Systems Constitutional: No fever/chills ENT: Positive for dental pain. Positive for dental abscess. Cardiovascular: Denies chest pain. Respiratory: Denies shortness of breath. Gastrointestinal:   No nausea, no vomiting.  Neurological: Negative for headaches ____________________________________________   PHYSICAL EXAM:  VITAL SIGNS: ED Triage Vitals  Enc Vitals Group     BP 05/16/17 0722 (!) 142/93     Pulse Rate 05/16/17 0722 72     Resp 05/16/17 0722 18     Temp 05/16/17 0722 98.2 F (36.8 C)     Temp Source 05/16/17 0722 Oral     SpO2 05/16/17 0722 99 %  Weight 05/16/17 0723 175 lb (79.4 kg)     Height 05/16/17 0723 5\' 11"  (1.803 m)     Head Circumference --      Peak Flow --      Pain Score 05/16/17 0722 6     Pain Loc --      Pain Edu? --      Excl. in GC? --    Constitutional: Alert and oriented. Well appearing and in no acute distress. Eyes: Conjunctivae are normal. PERRL. EOMI. Head: Atraumatic. Nose: No congestion/rhinnorhea. Mouth/Throat: Mucous membranes are moist.  Oropharynx non-erythematous. Right upper posterior molar with chronic cary present. Moderate gum edema surrounding the tooth. Area is tender to palpation with a tongue depressor. No active drainage noted at this  time. Neck: No stridor.   Hematological/Lymphatic/Immunilogical: No cervical lymphadenopathy. Cardiovascular: Normal rate, regular rhythm. Grossly normal heart sounds.  Good peripheral circulation. Respiratory: Normal respiratory effort.  No retractions. Lungs CTAB. Musculoskeletal: Normal gait noted. Range of motion upper and lower extremities without any difficulty. Neurologic:  Normal speech and language. No gross focal neurologic deficits are appreciated. No gait instability. Skin:  Skin is warm, dry and intact. No rash noted. Psychiatric: Mood and affect are normal. Speech and behavior are normal.  ____________________________________________   LABS (all labs ordered are listed, but only abnormal results are displayed)  Labs Reviewed - No data to display  PROCEDURES  Procedure(s) performed: None  Procedures  Critical Care performed: No  ____________________________________________   INITIAL IMPRESSION / ASSESSMENT AND PLAN / ED COURSE  Pertinent labs & imaging results that were available during my care of the patient were reviewed by me and considered in my medical decision making (see chart for details).  Patient was encouraged to continue taking his Pen-Vee K and keep his dental appointment for next week. He was also advised to [facility] if any STD test were desired. Patient left disgruntled because he wanted them done in the emergency room. Patient left cursing the provider.   ____________________________________________   FINAL CLINICAL IMPRESSION(S) / ED DIAGNOSES  Final diagnoses:  Dental abscess      NEW MEDICATIONS STARTED DURING THIS VISIT:  Discharge Medication List as of 05/16/2017  8:42 AM       Note:  This document was prepared using Dragon voice recognition software and may include unintentional dictation errors.    Tommi RumpsSummers, Lukas Pelcher L, PA-C 05/16/17 1226    Tommi RumpsSummers, Romond Pipkins L, PA-C 05/16/17  1227    Willy Eddyobinson, Patrick, MD 05/16/17 1325

## 2017-05-16 NOTE — ED Notes (Signed)
Went in to give discharge instructions  States he wanted to speak with provider again.

## 2017-05-16 NOTE — ED Triage Notes (Signed)
Pt to ED reporting he was seen in ED two days for dental abscess, was given antibiotics and then returned today for reported drainage. No chages were made in medication but   Pt has returned reporting he is worried he has mouth cancer and he needs a referral to be seen by the [facility] dentists to have a cancer screening. No swelling in throat, no difficulty swallowing or breathing.

## 2017-05-16 NOTE — ED Triage Notes (Signed)
Upper R dental pain x 2 days.

## 2017-08-06 ENCOUNTER — Emergency Department: Payer: Self-pay

## 2017-08-06 ENCOUNTER — Emergency Department
Admission: EM | Admit: 2017-08-06 | Discharge: 2017-08-07 | Disposition: A | Discharge status: Home / Self Care | Payer: Self-pay | Attending: Emergency Medicine | Admitting: Emergency Medicine

## 2017-08-06 ENCOUNTER — Encounter: Payer: Self-pay | Admitting: Emergency Medicine

## 2017-08-06 DIAGNOSIS — S39012A Strain of muscle, fascia and tendon of lower back, initial encounter: Secondary | ICD-10-CM | POA: Insufficient documentation

## 2017-08-06 DIAGNOSIS — Y929 Unspecified place or not applicable: Secondary | ICD-10-CM | POA: Insufficient documentation

## 2017-08-06 DIAGNOSIS — Y939 Activity, unspecified: Secondary | ICD-10-CM | POA: Insufficient documentation

## 2017-08-06 DIAGNOSIS — Y999 Unspecified external cause status: Secondary | ICD-10-CM | POA: Insufficient documentation

## 2017-08-06 DIAGNOSIS — X500XXA Overexertion from strenuous movement or load, initial encounter: Secondary | ICD-10-CM | POA: Insufficient documentation

## 2017-08-06 DIAGNOSIS — N50819 Testicular pain, unspecified: Secondary | ICD-10-CM | POA: Insufficient documentation

## 2017-08-06 DIAGNOSIS — F17210 Nicotine dependence, cigarettes, uncomplicated: Secondary | ICD-10-CM | POA: Insufficient documentation

## 2017-08-06 LAB — URINALYSIS, COMPLETE (UACMP) WITH MICROSCOPIC
Bacteria, UA: NONE SEEN
Bilirubin Urine: NEGATIVE
GLUCOSE, UA: NEGATIVE mg/dL
Hgb urine dipstick: NEGATIVE
Ketones, ur: NEGATIVE mg/dL
Leukocytes, UA: NEGATIVE
Nitrite: NEGATIVE
PH: 5 (ref 5.0–8.0)
PROTEIN: NEGATIVE mg/dL
Specific Gravity, Urine: 1.025 (ref 1.005–1.030)
Squamous Epithelial / LPF: NONE SEEN

## 2017-08-06 LAB — CHLAMYDIA/NGC RT PCR (ARMC ONLY)
Chlamydia Tr: NOT DETECTED
N GONORRHOEAE: NOT DETECTED

## 2017-08-06 NOTE — ED Provider Notes (Signed)
[facility] Emergency Department Provider Note  ____________________________________________  Time seen: Approximately 9:05 PM  I have reviewed the triage vital signs and the nursing notes.   HISTORY  Chief Complaint Testicle Pain (Denies injury ) and Back Pain    HPI Anthony Levine is a 41 y.o. male who presents the emergency department complaining of left back and testicular pain.  Patient reports that he was moving some wood with his father and began to experience some mild lower back pain.  Patient initially thought this was due to lifting.  Patient reports that he rode his bike the next day and then began to have testicular pain.  He denies an increase in pain on one side versus the other.  Patient denies any history of kidney stones.  He denies any dysuria, polyuria, hematuria.  Patient reports that there is no significant changes in pain with elevation of the testicles or palpation.  Patient reports that he has done a self testicular exam with no palpable abnormality.  Patient does have a history of a blood clot in an abdominal vein but denies any abdominal pain at this time.  Patient reports that testicular pain is constant, lower back pain is intermittent.  Patient denies any bowel or bladder dysfunction, saddle anesthesia, paresthesias.  No lower extremity pain, edema, erythema.  No other complaints at this time.  No medications for this complaint prior to arrival.  Past Medical History:  Diagnosis Date  . Blood clot in abdominal vein     There are no active problems to display for this patient.   Past Surgical History:  Procedure Laterality Date  . blood clot removal from abdomen      Prior to Admission medications   Medication Sig Start Date End Date Taking? Authorizing Provider  Carbamide Peroxide 20 % SOLN Use as directed 1 application in the mouth or throat 4 (four) times daily as needed. 08/04/16   Triplett, Rulon Eisenmenger B, FNP   HYDROcodone-acetaminophen (NORCO) 5-325 MG tablet Take 1 tablet by mouth 3 (three) times daily as needed. 12/26/16   Menshew, Charlesetta Ivory, PA-C  ibuprofen (ADVIL,MOTRIN) 800 MG tablet Take 1 tablet (800 mg total) by mouth every 8 (eight) hours as needed. 08/13/16   Evon Slack, PA-C  ketoconazole (NIZORAL) 2 % cream Apply 1 application topically daily. 01/23/16   Hagler, Jami L, PA-C  lidocaine (XYLOCAINE) 2 % solution Use as directed 20 mLs in the mouth or throat as needed for mouth pain. 12/27/15   Beers, Charmayne Sheer, PA-C  penicillin v potassium (VEETID) 500 MG tablet Take 1 tablet (500 mg total) by mouth 4 (four) times daily. 05/14/17   Menshew, Charlesetta Ivory, PA-C  predniSONE (STERAPRED UNI-PAK 21 TAB) 10 MG (21) TBPK tablet Take 6 tablets on day 1. Take 5 tablets on day 2. Take 4 tablets on day 3. Take 3 tablets on day 4. Take 2 tablets on day 5. Take 1 tablets on day 6. 03/20/17   Little, Traci M, PA-C    Allergies Tylenol [acetaminophen]  No family history on file.  Social History Social History   Tobacco Use  . Smoking status: Current Some Day Smoker    Packs/day: 0.10    Types: Cigarettes  . Smokeless tobacco: Never Used  Substance Use Topics  . Alcohol use: No  . Drug use: Yes    Types: Marijuana     Review of Systems  Constitutional: No fever/chills Eyes: No visual changes. No discharge ENT:  No upper respiratory complaints. Cardiovascular: no chest pain. Respiratory: no cough. No SOB. Gastrointestinal: No abdominal pain.  No nausea, no vomiting.  No diarrhea.  No constipation. Genitourinary: Negative for dysuria. No hematuria.  Positive for bilateral testicular pain Musculoskeletal: Positive for left-sided lower back pain Skin: Negative for rash, abrasions, lacerations, ecchymosis. Neurological: Negative for headaches, focal weakness or numbness. 10-point ROS otherwise negative.  ____________________________________________   PHYSICAL EXAM:  VITAL  SIGNS: ED Triage Vitals  Enc Vitals Group     BP 08/06/17 1936 (!) 157/94     Pulse Rate 08/06/17 1936 60     Resp --      Temp 08/06/17 1936 98.9 F (37.2 C)     Temp Source 08/06/17 1936 Oral     SpO2 08/06/17 1936 97 %     Weight 08/06/17 1937 175 lb (79.4 kg)     Height 08/06/17 1937 5\' 11"  (1.803 m)     Head Circumference --      Peak Flow --      Pain Score --      Pain Loc --      Pain Edu? --      Excl. in GC? --      Constitutional: Alert and oriented. Well appearing and in no acute distress. Eyes: Conjunctivae are normal. PERRL. EOMI. Head: Atraumatic. ENT:      Ears:       Nose: No congestion/rhinnorhea.      Mouth/Throat: Mucous membranes are moist.  Neck: No stridor.  No cervical spine tenderness to palpation.  Cardiovascular: Normal rate, regular rhythm. Normal S1 and S2.  Good peripheral circulation. Respiratory: Normal respiratory effort without tachypnea or retractions. Lungs CTAB. Good air entry to the bases with no decreased or absent breath sounds. Gastrointestinal: Bowel sounds 4 quadrants. Soft and nontender to palpation. No guarding or rigidity. No palpable masses. No distention. No CVA tenderness. Genitourinary: No visible external abnormality.  No lesions or shank no drainage.  Patient is nontender to palpation over bilateral testicles.  No palpable abnormality. Musculoskeletal: Full range of motion to all extremities. No gross deformities appreciated.  Nontender to palpation midline spinal processes.  No palpable abnormality or step-off.  Patient is diffusely tender to palpation to the left-sided lumbar paraspinal muscle group.  No palpable abnormality.  No tenderness to palpation over bilateral sciatic notches.  Negative straight leg raise bilaterally.  Dorsalis pedis pulse intact bilateral lower extremity.  Sensation intact and equal bilateral lower extremities. Neurologic:  Normal speech and language. No gross focal neurologic deficits are appreciated.   Skin:  Skin is warm, dry and intact. No rash noted. Psychiatric: Mood and affect are normal. Speech and behavior are normal. Patient exhibits appropriate insight and judgement.   ____________________________________________   LABS (all labs ordered are listed, but only abnormal results are displayed)  Labs Reviewed  URINALYSIS, COMPLETE (UACMP) WITH MICROSCOPIC - Abnormal; Notable for the following components:      Result Value   Color, Urine YELLOW (*)    APPearance CLEAR (*)    All other components within normal limits  CHLAMYDIA/NGC RT PCR ([facility] ONLY)   ____________________________________________  EKG   ____________________________________________  RADIOLOGY Festus Barren Roczen Waymire, personally viewed and evaluated the written report by the radiologist.  No results found.  ____________________________________________    PROCEDURES  Procedure(s) performed:    Procedures    Medications - No data to display   ____________________________________________   INITIAL IMPRESSION / ASSESSMENT AND PLAN / ED COURSE  Pertinent labs & imaging results that were available during my care of the patient were reviewed by me and considered in my medical decision making (see chart for details).  Review of the [place] CSRS was performed in accordance of the [facility] prior to dispensing any controlled drugs.     Patient's diagnosis is consistent with testicular pain and lower back pain..  Due to patient's presentation of intermittent low back pain and testicular pain, patient was evaluated with urinalysis, gonorrhea and chlamydia, ultrasound.  Patient's urinalysis and gonorrhea and chlamydia returned with reassuring results with no signs of infection, he gonorrhea or chlamydia.  At this time, differential still included testicular contusion, torsion, epididymitis, muscular strain, hernia.  I feel that symptoms are likely musculoskeletal in nature from his activity, however he  was evaluated with the above labs and imaging.  At this time, results had not returned for ultrasound at the end of my shift.  Patient's history, physical exam, lab results, pending ultrasound was discussed with attending provider, Dr. Zenda AlpersWebster.  She will assume patient care at this time pending ultrasound report.    ____________________________________________  FINAL CLINICAL IMPRESSION(S) / ED DIAGNOSES  Final diagnoses:  Testicular pain      NEW MEDICATIONS STARTED DURING THIS VISIT:        This chart was dictated using voice recognition software/Dragon. Despite best efforts to proofread, errors can occur which can change the meaning. Any change was purely unintentional.    Racheal PatchesCuthriell, Ezmeralda Stefanick D, PA-C 08/07/17 0021    Rebecka ApleyWebster, Allison P, MD 08/07/17 [phone]

## 2017-08-06 NOTE — ED Triage Notes (Signed)
Pt reports Friday he was carrying wood reports rode his bicycle for a little bit and started to have pain to testicles and lower back reports  He is not able to stretch his back.Pt denies any swelling to testicles" is like pain but no pain"  Denies any injury. Denies any other symptom at present. Pt talks in complete sentences no distress noted

## 2017-08-06 NOTE — ED Notes (Signed)
Patient transported to Ultrasound 

## 2017-08-07 MED ORDER — ETODOLAC 200 MG PO CAPS: 200.0000 mg | ORAL_CAPSULE | Freq: Three times a day (TID) | ORAL | 0 refills | Status: DC

## 2017-08-07 MED ORDER — KETOROLAC TROMETHAMINE 60 MG/2ML IM SOLN
60.0000 mg | Freq: Once | INTRAMUSCULAR | Status: AC
  Administered 2017-08-07: 60 mg via INTRAMUSCULAR
  Filled 2017-08-07: qty 2

## 2017-08-07 MED ORDER — LIDOCAINE 5 % EX PTCH: 1.0000 | MEDICATED_PATCH | Freq: Two times a day (BID) | CUTANEOUS | 0 refills | Status: DC

## 2017-08-07 NOTE — Discharge Instructions (Signed)
PLease follow up with urology for further evaluation of your testicle pain. Please return with any worsened condition or any worsened pain.

## 2017-08-07 NOTE — ED Provider Notes (Signed)
-----------------------------------------   12:35 AM on 08/07/2017 -----------------------------------------   Blood pressure (!) 153/96, pulse (!) 56, temperature 98.9 F (37.2 C), temperature source Oral, resp. rate 16, height 5\' 11"  (1.803 m), weight 79.4 kg (175 lb), SpO2 99 %.  [facility].  In short, Anthony Levine is a 41 y.o. male with a chief complaint of Testicle Pain (Denies injury ) and Back Pain .  Refer to the original H&P for additional details.  The current plan of care is to follow up the results of the US scrotum and reassess the patient.  The patient's ultrasound showed a symmetrical apparent of the testicles with no testicular mass, torsion, inflammatory changes. The patient does have some scattered tiny punctate calcifications in both testicles. I did give the patient a dose of Toradol. As the ultrasound is unremarkable and the patient's pain is controlled I feel that he may be discharged home to follow-up with urology. The patient has no further concerns or complaints at this time. He will be discharged home.        Rebecka ApleyWebster, Tijuana Scheidegger P, MD 08/07/17 [phone]

## 2017-08-22 ENCOUNTER — Emergency Department: Payer: Self-pay

## 2017-08-22 ENCOUNTER — Other Ambulatory Visit: Payer: Self-pay

## 2017-08-22 ENCOUNTER — Encounter: Payer: Self-pay | Admitting: Emergency Medicine

## 2017-08-22 ENCOUNTER — Emergency Department
Admission: EM | Admit: 2017-08-22 | Discharge: 2017-08-22 | Disposition: A | Discharge status: Home / Self Care | Payer: Self-pay | Attending: Emergency Medicine | Admitting: Emergency Medicine

## 2017-08-22 DIAGNOSIS — R1033 Periumbilical pain: Secondary | ICD-10-CM

## 2017-08-22 DIAGNOSIS — K429 Umbilical hernia without obstruction or gangrene: Secondary | ICD-10-CM | POA: Insufficient documentation

## 2017-08-22 DIAGNOSIS — Z79899 Other long term (current) drug therapy: Secondary | ICD-10-CM | POA: Insufficient documentation

## 2017-08-22 DIAGNOSIS — F17210 Nicotine dependence, cigarettes, uncomplicated: Secondary | ICD-10-CM | POA: Insufficient documentation

## 2017-08-22 LAB — URINALYSIS, COMPLETE (UACMP) WITH MICROSCOPIC
BACTERIA UA: NONE SEEN
BILIRUBIN URINE: NEGATIVE
Glucose, UA: NEGATIVE mg/dL
HGB URINE DIPSTICK: NEGATIVE
KETONES UR: NEGATIVE mg/dL
LEUKOCYTES UA: NEGATIVE
NITRITE: NEGATIVE
PH: 6 (ref 5.0–8.0)
Protein, ur: NEGATIVE mg/dL
RBC / HPF: NONE SEEN RBC/hpf (ref 0–5)
SPECIFIC GRAVITY, URINE: 1.02 (ref 1.005–1.030)
SQUAMOUS EPITHELIAL / LPF: NONE SEEN

## 2017-08-22 LAB — COMPREHENSIVE METABOLIC PANEL
ALT: 15 U/L — AB (ref 17–63)
ANION GAP: 7 (ref 5–15)
AST: 24 U/L (ref 15–41)
Albumin: 4.6 g/dL (ref 3.5–5.0)
Alkaline Phosphatase: 61 U/L (ref 38–126)
BILIRUBIN TOTAL: 0.6 mg/dL (ref 0.3–1.2)
BUN: 19 mg/dL (ref 6–20)
CHLORIDE: 105 mmol/L (ref 101–111)
CO2: 27 mmol/L (ref 22–32)
Calcium: 9.3 mg/dL (ref 8.9–10.3)
Creatinine, Ser: 1.15 mg/dL (ref 0.61–1.24)
GFR calc non Af Amer: >60 mL/min (ref >60)
Glucose, Bld: 153 mg/dL — ABNORMAL HIGH (ref 65–99)
Potassium: 4.2 mmol/L (ref 3.5–5.1)
SODIUM: 139 mmol/L (ref 135–145)
TOTAL PROTEIN: 7.5 g/dL (ref 6.5–8.1)

## 2017-08-22 LAB — CBC
HEMATOCRIT: 44 % (ref 40.0–52.0)
Hemoglobin: 14.3 g/dL (ref 13.0–18.0)
MCH: 28.8 pg (ref 26.0–34.0)
MCHC: 32.5 g/dL (ref 32.0–36.0)
MCV: 88.5 fL (ref 80.0–100.0)
PLATELETS: 194 10*3/uL (ref 150–440)
RBC: 4.97 MIL/uL (ref 4.40–5.90)
RDW: 17.4 % — ABNORMAL HIGH (ref 11.5–14.5)
WBC: 8 10*3/uL (ref 3.8–10.6)

## 2017-08-22 LAB — LIPASE, BLOOD: Lipase: 34 U/L (ref 11–51)

## 2017-08-22 MED ORDER — IOPAMIDOL (ISOVUE-300) INJECTION 61%
100.0000 mL | Freq: Once | INTRAVENOUS | Status: AC | PRN
  Administered 2017-08-22: 100 mL via INTRAVENOUS

## 2017-08-22 NOTE — ED Triage Notes (Signed)
Pt comes  Into the ED via POV c/o abdominal pain over the umbilicus and left side groin pain.  Patient states he has nausea but no episode of emesis at this time and denies diarrhea or fevers.  Patient in NAD at this time with even and unlabored respirations and ambulatory to triage.  Patient denies chest pain, shortness of breath, or dizziness.

## 2017-08-22 NOTE — Discharge Instructions (Signed)

## 2017-08-22 NOTE — ED Notes (Signed)
Patient transported to CT 

## 2017-08-22 NOTE — ED Provider Notes (Signed)
[facility] Emergency Department Provider Note  ____________________________________________  Time seen: Approximately 1:58 PM  I have reviewed the triage vital signs and the nursing notes.   HISTORY  Chief Complaint Abdominal Pain   HPI Anthony Levine is a 41 y.o. male no significant past medical history who presents for evaluation of abdominal pain. Patient reports that the pain started yesterday evening. It is located above his umbilicus, only present when he contracts the muscles of his abdomen (sitting up and twisting his torso), no pain while laying bed or at rest. Pain is 5 out of 10, nonradiating. No nausea, vomiting, fever, chills, dysuria, constipation, diarrhea, hematuria. Patient reports one prior abdominal surgery over 20 years ago for which a blood clot was removed from his abdomen. He does not know where the blood clot was and is not on any medications for this. Patient also has been complaining of intermittent pain in his left scrotum that he describes as mild throbbing pain ongoing for several weeks but none at this time. The last day that he had pain in his scrotum was a few days ago.He was seen here on 11/6 for this pain and had STD testing and testicular US which were both unremarkable.   Past Medical History:  Diagnosis Date  . Blood clot in abdominal vein     There are no active problems to display for this patient.   Past Surgical History:  Procedure Laterality Date  . blood clot removal from abdomen      Prior to Admission medications   Medication Sig Start Date End Date Taking? Authorizing Provider  Carbamide Peroxide 20 % SOLN Use as directed 1 application in the mouth or throat 4 (four) times daily as needed. Patient not taking: Reported on 08/22/2017 08/04/16   Kem Boroughsriplett, Cari B, FNP  etodolac (LODINE) 200 MG capsule Take 1 capsule (200 mg total) every 8 (eight) hours by mouth. Patient not taking: Reported on  08/22/2017 08/07/17   Rebecka ApleyWebster, Allison P, MD  HYDROcodone-acetaminophen [facility](NORCO) 5-325 MG tablet Take 1 tablet by mouth 3 (three) times daily as needed. Patient not taking: Reported on 08/22/2017 12/26/16   Menshew, Charlesetta IvoryJenise V Bacon, PA-C  ibuprofen (ADVIL,MOTRIN) 800 MG tablet Take 1 tablet (800 mg total) by mouth every 8 (eight) hours as needed. Patient not taking: Reported on 08/22/2017 08/13/16   Evon SlackGaines, Thomas C, PA-C  ketoconazole (NIZORAL) 2 % cream Apply 1 application topically daily. Patient not taking: Reported on 08/22/2017 01/23/16   Hagler, Jami L, PA-C  lidocaine (LIDODERM) 5 % Place 1 patch every 12 (twelve) hours onto the skin. Remove & Discard patch within 12 hours or as directed by MD Patient not taking: Reported on 08/22/2017 08/07/17 08/07/18  Rebecka ApleyWebster, Allison P, MD  lidocaine (XYLOCAINE) 2 % solution Use as directed 20 mLs in the mouth or throat as needed for mouth pain. Patient not taking: Reported on 08/22/2017 12/27/15   Evangeline DakinBeers, Charles M, PA-C  penicillin v potassium (VEETID) 500 MG tablet Take 1 tablet (500 mg total) by mouth 4 (four) times daily. Patient not taking: Reported on 08/22/2017 05/14/17   Menshew, Charlesetta IvoryJenise V Bacon, PA-C  predniSONE (STERAPRED UNI-PAK 21 TAB) 10 MG (21) TBPK tablet Take 6 tablets on day 1. Take 5 tablets on day 2. Take 4 tablets on day 3. Take 3 tablets on day 4. Take 2 tablets on day 5. Take 1 tablets on day 6. Patient not taking: Reported on 08/22/2017 03/20/17   Little, Jordan Likesraci M,  PA-C    Allergies Tylenol [acetaminophen]  No family history on file.  Social History Social History   Tobacco Use  . Smoking status: Current Some Day Smoker    Packs/day: 0.10    Types: Cigarettes  . Smokeless tobacco: Never Used  Substance Use Topics  . Alcohol use: No  . Drug use: Yes    Types: Marijuana    Review of Systems  Constitutional: Negative for fever. Eyes: Negative for visual changes. ENT: Negative for sore throat. Neck: No neck pain    Cardiovascular: Negative for chest pain. Respiratory: Negative for shortness of breath. Gastrointestinal: + umbilical abdominal pain. No vomiting or diarrhea. Genitourinary: Negative for dysuria. + L testicular pain Musculoskeletal: Negative for back pain. Skin: Negative for rash. Neurological: Negative for headaches, weakness or numbness. Psych: No SI or HI  ____________________________________________   PHYSICAL EXAM:  VITAL SIGNS: ED Triage Vitals [08/22/17 1149]  Enc Vitals Group     BP (!) 148/91     Pulse Rate 67     Resp      Temp 98.6 F (37 C)     Temp Source Oral     SpO2 98 %     Weight 175 lb (79.4 kg)     Height 5\' 11"  (1.803 m)     Head Circumference      Peak Flow      Pain Score 10     Pain Loc      Pain Edu?      Excl. in GC?     Constitutional: Alert and oriented. Well appearing and in no apparent distress. HEENT:      Head: Normocephalic and atraumatic.         Eyes: Conjunctivae are normal. Sclera is non-icteric.       Mouth/Throat: Mucous membranes are moist.       Neck: Supple with no signs of meningismus. Cardiovascular: Regular rate and rhythm. No murmurs, gallops, or rubs. 2+ symmetrical distal pulses are present in all extremities. No JVD. Respiratory: Normal respiratory effort. Lungs are clear to auscultation bilaterally. No wheezes, crackles, or rhonchi.  Gastrointestinal: Soft, mild ttp over the area above the umbilicus where a small (~ 1cm) round mass is palpable. NO overlying skin changes, and non distended with positive bowel sounds. No rebound or guarding. Genitourinary: No CVA tenderness. Bilateral testicles are descended with no tenderness to palpation, bilateral positive cremasteric reflexes are present, no swelling or erythema of the scrotum. No evidence of inguinal hernia. Musculoskeletal: Nontender with normal range of motion in all extremities. No edema, cyanosis, or erythema of extremities. Neurologic: Normal speech and language.  Face is symmetric. Moving all extremities. No gross focal neurologic deficits are appreciated. Skin: Skin is warm, dry and intact. No rash noted. Psychiatric: Mood and affect are normal. Speech and behavior are normal.  ____________________________________________   LABS (all labs ordered are listed, but only abnormal results are displayed)  Labs Reviewed  COMPREHENSIVE METABOLIC PANEL - Abnormal; Notable for the following components:      Result Value   Glucose, Bld 153 (*)    ALT 15 (*)    All other components within normal limits  CBC - Abnormal; Notable for the following components:   RDW 17.4 (*)    All other components within normal limits  URINALYSIS, COMPLETE (UACMP) WITH MICROSCOPIC - Abnormal; Notable for the following components:   Color, Urine YELLOW (*)    APPearance CLEAR (*)    All other components within normal  limits  LIPASE, BLOOD   ____________________________________________  EKG  none  ____________________________________________  RADIOLOGY  CT a/p:  1. No acute finding. 2. 3 mm right renal calculus. 3. Aortoiliac atherosclerosis. ____________________________________________   PROCEDURES  Procedure(s) performed: None Procedures Critical Care performed:  None ____________________________________________   INITIAL IMPRESSION / ASSESSMENT AND PLAN / ED COURSE   41 y.o. male no significant past medical history who presents for evaluation of umbilical abdominal pain since last night. Patient has a 1 cm round palpable mass located above his umbilicus which is the site of his pain. This is consistent with very small fat containing hernia from previous surgical site. CT shows no evidence of obstruction, bowel containing hernia, or abscess. Patient's blood work is within normal limits. GU exam with no acute findings. At this time with normal CT, normal blood work, and normal vital signs and believe patient is safe for discharge and continue management as  an outpatient. We'll refer patient back to his primary care doctor.      As part of my medical decision making, I reviewed the following data within the electronic MEDICAL RECORD NUMBER Nursing notes reviewed and incorporated, Labs reviewed , Radiograph reviewed , Notes from prior ED visits and [place] Controlled Substance Database    Pertinent labs & imaging results that were available during my care of the patient were reviewed by me and considered in my medical decision making (see chart for details).    ____________________________________________   FINAL CLINICAL IMPRESSION(S) / ED DIAGNOSES  Final diagnoses:  Periumbilical abdominal pain  Umbilical hernia without obstruction and without gangrene      NEW MEDICATIONS STARTED DURING THIS VISIT:  ED Discharge Orders    None       Note:  This document was prepared using Dragon voice recognition software and may include unintentional dictation errors.    Nita SickleVeronese, [place], MD 08/22/17 [phone]

## 2017-08-22 NOTE — ED Notes (Signed)
Patient complaining of abdominal pain starting last PM.  NAD.

## 2017-10-09 ENCOUNTER — Encounter: Payer: Self-pay | Admitting: Emergency Medicine

## 2017-10-09 ENCOUNTER — Emergency Department
Admission: EM | Admit: 2017-10-09 | Discharge: 2017-10-09 | Disposition: A | Discharge status: Home / Self Care | Payer: Self-pay | Attending: Emergency Medicine | Admitting: Emergency Medicine

## 2017-10-09 DIAGNOSIS — F17210 Nicotine dependence, cigarettes, uncomplicated: Secondary | ICD-10-CM | POA: Insufficient documentation

## 2017-10-09 DIAGNOSIS — R1013 Epigastric pain: Secondary | ICD-10-CM | POA: Insufficient documentation

## 2017-10-09 DIAGNOSIS — R112 Nausea with vomiting, unspecified: Secondary | ICD-10-CM | POA: Insufficient documentation

## 2017-10-09 LAB — COMPREHENSIVE METABOLIC PANEL
ALK PHOS: 65 U/L (ref 38–126)
ALT: 14 U/L — AB (ref 17–63)
AST: 21 U/L (ref 15–41)
Albumin: 4.4 g/dL (ref 3.5–5.0)
Anion gap: 9 (ref 5–15)
BILIRUBIN TOTAL: 0.2 mg/dL — AB (ref 0.3–1.2)
BUN: 14 mg/dL (ref 6–20)
CO2: 27 mmol/L (ref 22–32)
CREATININE: 1.09 mg/dL (ref 0.61–1.24)
Calcium: 9.5 mg/dL (ref 8.9–10.3)
Chloride: 103 mmol/L (ref 101–111)
GFR calc Af Amer: >60 mL/min (ref >60)
Glucose, Bld: 134 mg/dL — ABNORMAL HIGH (ref 65–99)
Potassium: 4 mmol/L (ref 3.5–5.1)
Sodium: 139 mmol/L (ref 135–145)
Total Protein: 7.3 g/dL (ref 6.5–8.1)

## 2017-10-09 LAB — CBC
HCT: 41 % (ref 40.0–52.0)
Hemoglobin: 13.4 g/dL (ref 13.0–18.0)
MCH: 28.9 pg (ref 26.0–34.0)
MCHC: 32.8 g/dL (ref 32.0–36.0)
MCV: 88.1 fL (ref 80.0–100.0)
PLATELETS: 201 10*3/uL (ref 150–440)
RBC: 4.65 MIL/uL (ref 4.40–5.90)
RDW: 17.1 % — AB (ref 11.5–14.5)
WBC: 13.6 10*3/uL — AB (ref 3.8–10.6)

## 2017-10-09 LAB — URINALYSIS, COMPLETE (UACMP) WITH MICROSCOPIC
BILIRUBIN URINE: NEGATIVE
Bacteria, UA: NONE SEEN
Glucose, UA: NEGATIVE mg/dL
Hgb urine dipstick: NEGATIVE
KETONES UR: NEGATIVE mg/dL
Leukocytes, UA: NEGATIVE
Nitrite: NEGATIVE
Protein, ur: NEGATIVE mg/dL
Specific Gravity, Urine: 1.019 (ref 1.005–1.030)
pH: 6 (ref 5.0–8.0)

## 2017-10-09 LAB — LIPASE, BLOOD: Lipase: 35 U/L (ref 11–51)

## 2017-10-09 MED ORDER — ALUM & MAG HYDROXIDE-SIMETH 200-200-20 MG/5ML PO SUSP: 30.0000 mL | ORAL | 0 refills | Status: DC | PRN

## 2017-10-09 MED ORDER — ONDANSETRON 4 MG PO TBDP
4.0000 mg | ORAL_TABLET | Freq: Once | ORAL | Status: AC | PRN
  Administered 2017-10-09: 4 mg via ORAL
  Filled 2017-10-09: qty 1

## 2017-10-09 MED ORDER — PANTOPRAZOLE SODIUM 40 MG PO TBEC: 40.0000 mg | DELAYED_RELEASE_TABLET | Freq: Every day | ORAL | 1 refills | Status: DC

## 2017-10-09 MED ORDER — ONDANSETRON 4 MG PO TBDP: 4.0000 mg | ORAL_TABLET | Freq: Three times a day (TID) | ORAL | 0 refills | Status: DC | PRN

## 2017-10-09 NOTE — ED Triage Notes (Signed)
Pt in via POV with complaints of new onset nausea/vomiting and abdominal pain this afternoon.  Pt states he is unable to keep anything down.  NAD noted at this time.

## 2017-10-09 NOTE — ED Provider Notes (Signed)
[facility] Emergency Department Provider Note  Time seen: 5:42 PM  I have reviewed the triage vital signs and the nursing notes.   HISTORY  Chief Complaint Emesis    HPI Anthony Levine is a 42 y.o. male who presents to the emergency department for nausea vomiting and upper abdominal discomfort.  According to the patient since around 1 PM today he has been nauseated with several episodes of vomiting.  States a normal bowel movement several hours ago.  Denies diarrhea.  Denies constipation.  Denies dysuria.  Largely negative review of systems.  Patient states mild epigastric discomfort which is now gone, he says it was just from vomiting.  Denies any pain at this point.  Received ODT Zofran in triage and states the nausea has resolved as well.   Past Medical History:  Diagnosis Date  . Blood clot in abdominal vein     There are no active problems to display for this patient.   Past Surgical History:  Procedure Laterality Date  . blood clot removal from abdomen      Prior to Admission medications   Medication Sig Start Date End Date Taking? Authorizing Provider  Carbamide Peroxide 20 % SOLN Use as directed 1 application in the mouth or throat 4 (four) times daily as needed. Patient not taking: Reported on 08/22/2017 08/04/16   Kem Boroughsriplett, Cari B, FNP  etodolac (LODINE) 200 MG capsule Take 1 capsule (200 mg total) every 8 (eight) hours by mouth. Patient not taking: Reported on 08/22/2017 08/07/17   Rebecka ApleyWebster, Allison P, MD  HYDROcodone-acetaminophen [facility](NORCO) 5-325 MG tablet Take 1 tablet by mouth 3 (three) times daily as needed. Patient not taking: Reported on 08/22/2017 12/26/16   Menshew, Charlesetta IvoryJenise V Bacon, PA-C  ibuprofen (ADVIL,MOTRIN) 800 MG tablet Take 1 tablet (800 mg total) by mouth every 8 (eight) hours as needed. Patient not taking: Reported on 08/22/2017 08/13/16   Evon SlackGaines, Thomas C, PA-C  ketoconazole (NIZORAL) 2 % cream Apply 1 application  topically daily. Patient not taking: Reported on 08/22/2017 01/23/16   Hagler, Jami L, PA-C  lidocaine (LIDODERM) 5 % Place 1 patch every 12 (twelve) hours onto the skin. Remove & Discard patch within 12 hours or as directed by MD Patient not taking: Reported on 08/22/2017 08/07/17 08/07/18  Rebecka ApleyWebster, Allison P, MD  lidocaine (XYLOCAINE) 2 % solution Use as directed 20 mLs in the mouth or throat as needed for mouth pain. Patient not taking: Reported on 08/22/2017 12/27/15   Evangeline DakinBeers, Charles M, PA-C  penicillin v potassium (VEETID) 500 MG tablet Take 1 tablet (500 mg total) by mouth 4 (four) times daily. Patient not taking: Reported on 08/22/2017 05/14/17   Menshew, Charlesetta IvoryJenise V Bacon, PA-C  predniSONE (STERAPRED UNI-PAK 21 TAB) 10 MG (21) TBPK tablet Take 6 tablets on day 1. Take 5 tablets on day 2. Take 4 tablets on day 3. Take 3 tablets on day 4. Take 2 tablets on day 5. Take 1 tablets on day 6. Patient not taking: Reported on 08/22/2017 03/20/17   Little, Traci M, PA-C    Allergies  Allergen Reactions  . Tylenol [Acetaminophen] Nausea And Vomiting    No family history on file.  Social History Social History   Tobacco Use  . Smoking status: Current Some Day Smoker    Packs/day: 0.10    Types: Cigarettes  . Smokeless tobacco: Never Used  Substance Use Topics  . Alcohol use: No  . Drug use: Yes    Types: Marijuana  Review of Systems Constitutional: Negative for fever. Eyes: Negative for visual complaints ENT: Negative for congestion Cardiovascular: Negative for chest pain. Respiratory: Negative for shortness of breath. Gastrointestinal: Epigastric pain, now resolved.  Positive for nausea and vomiting, now resolved.  Negative for diarrhea.  Normal bowel movements today. Genitourinary: Negative for dysuria. Musculoskeletal: Negative for back pain. Skin: Negative for rash. Neurological: Negative for headache All other ROS  negative  ____________________________________________   PHYSICAL EXAM:  VITAL SIGNS: ED Triage Vitals [10/09/17 1617]  Enc Vitals Group     BP (!) 128/100     Pulse Rate (!) 58     Resp      Temp 97.7 F (36.5 C)     Temp Source Oral     SpO2 97 %     Weight 175 lb (79.4 kg)     Height 5\' 11"  (1.803 m)     Head Circumference      Peak Flow      Pain Score 10     Pain Loc      Pain Edu?      Excl. in GC?    Constitutional: Alert and oriented. Well appearing and in no distress. Eyes: Normal exam ENT   Head: Normocephalic and atraumatic.   Mouth/Throat: Mucous membranes are moist. Cardiovascular: Normal rate, regular rhythm. No murmur Respiratory: Normal respiratory effort without tachypnea nor retractions. Breath sounds are clear  Gastrointestinal: Soft and nontender. No distention.   Musculoskeletal: Nontender with normal range of motion in all extremities.  Neurologic:  Normal speech and language. No gross focal neurologic deficits Skin:  Skin is warm, dry and intact.  Psychiatric: Mood and affect are normal.   ____________________________________________   INITIAL IMPRESSION / ASSESSMENT AND PLAN / ED COURSE  Pertinent labs & imaging results that were available during my care of the patient were reviewed by me and considered in my medical decision making (see chart for details).  Patient presents to the emergency department for nausea vomiting epigastric pain.  Differential would include gastritis, gastroenteritis, pancreatitis, gallbladder disease.  Patient's labs are largely within normal limits besides a mild leukocytosis.  Normal LFTs, normal lipase, normal urinalysis.  Patient has a completely nontender exam at this time and states his symptoms have resolved.  I discussed the possibility of gastritis however suspect likely gastroenteritis.  We will discharge with Zofran, Protonix, discussed over-the-counter Maalox if symptoms recur.  Patient agreeable to  this plan of care.  I did review the patient's records, he last had a CT scan approximately 5 or 6 weeks ago which was normal.  I discussed return precautions and if his pain worsens or he developed a fever he is to return to the emergency department for consideration of a repeat CT scan however at this time as his symptoms are gone and he has a nontender exam I do not believe repeat CT imaging would be of much benefit and additional radiation could pose a risk to the patient.  ____________________________________________   FINAL CLINICAL IMPRESSION(S) / ED DIAGNOSES  Nausea vomiting Epigastric pain    Minna Antis, MD 10/09/17 1745

## 2017-12-09 ENCOUNTER — Other Ambulatory Visit: Payer: Self-pay

## 2017-12-09 ENCOUNTER — Encounter: Payer: Self-pay | Admitting: Emergency Medicine

## 2017-12-09 ENCOUNTER — Emergency Department
Admission: EM | Admit: 2017-12-09 | Discharge: 2017-12-09 | Disposition: A | Discharge status: Home / Self Care | Payer: Self-pay | Attending: Emergency Medicine | Admitting: Emergency Medicine

## 2017-12-09 DIAGNOSIS — F17210 Nicotine dependence, cigarettes, uncomplicated: Secondary | ICD-10-CM | POA: Insufficient documentation

## 2017-12-09 DIAGNOSIS — R42 Dizziness and giddiness: Secondary | ICD-10-CM | POA: Insufficient documentation

## 2017-12-09 DIAGNOSIS — R112 Nausea with vomiting, unspecified: Secondary | ICD-10-CM | POA: Insufficient documentation

## 2017-12-09 DIAGNOSIS — Z5329 Procedure and treatment not carried out because of patient's decision for other reasons: Secondary | ICD-10-CM | POA: Insufficient documentation

## 2017-12-09 LAB — COMPREHENSIVE METABOLIC PANEL
ALK PHOS: 72 U/L (ref 38–126)
ALT: 16 U/L — AB (ref 17–63)
ANION GAP: 7 (ref 5–15)
AST: 25 U/L (ref 15–41)
Albumin: 4.3 g/dL (ref 3.5–5.0)
BILIRUBIN TOTAL: 0.5 mg/dL (ref 0.3–1.2)
BUN: 14 mg/dL (ref 6–20)
CALCIUM: 9.1 mg/dL (ref 8.9–10.3)
CO2: 28 mmol/L (ref 22–32)
Chloride: 103 mmol/L (ref 101–111)
Creatinine, Ser: 1.11 mg/dL (ref 0.61–1.24)
GFR calc non Af Amer: >60 mL/min (ref >60)
Glucose, Bld: 114 mg/dL — ABNORMAL HIGH (ref 65–99)
Potassium: 3.9 mmol/L (ref 3.5–5.1)
Sodium: 138 mmol/L (ref 135–145)
TOTAL PROTEIN: 7.4 g/dL (ref 6.5–8.1)

## 2017-12-09 LAB — URINALYSIS, COMPLETE (UACMP) WITH MICROSCOPIC
Bacteria, UA: NONE SEEN
Bilirubin Urine: NEGATIVE
GLUCOSE, UA: NEGATIVE mg/dL
Hgb urine dipstick: NEGATIVE
Ketones, ur: NEGATIVE mg/dL
Leukocytes, UA: NEGATIVE
NITRITE: NEGATIVE
PH: 5 (ref 5.0–8.0)
PROTEIN: NEGATIVE mg/dL
Specific Gravity, Urine: 1.024 (ref 1.005–1.030)

## 2017-12-09 LAB — LIPASE, BLOOD: Lipase: 28 U/L (ref 11–51)

## 2017-12-09 LAB — CBC
HCT: 42.5 % (ref 40.0–52.0)
HEMOGLOBIN: 14.3 g/dL (ref 13.0–18.0)
MCH: 29.8 pg (ref 26.0–34.0)
MCHC: 33.6 g/dL (ref 32.0–36.0)
MCV: 88.6 fL (ref 80.0–100.0)
Platelets: 188 10*3/uL (ref 150–440)
RBC: 4.8 MIL/uL (ref 4.40–5.90)
RDW: 16.6 % — ABNORMAL HIGH (ref 11.5–14.5)
WBC: 7.8 10*3/uL (ref 3.8–10.6)

## 2017-12-09 NOTE — ED Notes (Signed)
Reported to Clinical research associatewriter that patient had left the ED without discharge instruction.

## 2017-12-09 NOTE — Discharge Instructions (Signed)
the testing we've done here previously has been normal. I am not sure why you feel like you having dizziness on the right side of your head. Your exam today was also normal. The nausea and vomiting may get better if you stop smoking marijuana. Thatcan make you have a lot of vomiting. Otherwise I would have you follow-up with neurology either here, I have included the name of one of the neurologist in town, Or [facility].

## 2017-12-09 NOTE — ED Triage Notes (Signed)
Pt states he has been dizzy and vomiting since he had an MRI about a month ago.  Pt states he has been here and [facility] multiple times for dizziness and vomiting. Pt also states when he is dizzy his head hurts.

## 2017-12-09 NOTE — ED Notes (Signed)
Iv removal. Site clean. Pt leaves ama

## 2017-12-09 NOTE — ED Provider Notes (Signed)
[facility] Emergency Department Provider Note   ____________________________________________   None    (approximate)  I have reviewed the triage vital signs and the nursing notes.   HISTORY  Chief Complaint Emesis and Dizziness    HPI Anthony Levine is a 42 y.o. male Who says he's had nausea and vomiting. He last vomited early this morning. He says he is dizzy on the right side of his head. He said it started after he [facility]. I've [facility] and [facility] and can find no evidence of him ever having had an MRI there recently. When asked about this he said then CT scan. His CT was read as normal. Patient is not acting dizzy or having any vomiting here now. He has had recorded in medical record the fact that he smelled strongly of marijuana in the past.   Past Medical History:  Diagnosis Date  . Blood clot in abdominal vein     There are no active problems to display for this patient.   Past Surgical History:  Procedure Laterality Date  . blood clot removal from abdomen      Prior to Admission medications   Medication Sig Start Date End Date Taking? Authorizing Provider  alum & mag hydroxide-simeth (MAALOX REGULAR STRENGTH) 200-200-20 MG/5ML suspension Take 30 mLs by mouth every 4 (four) hours as needed for indigestion or heartburn. Patient not taking: Reported on 12/09/2017 10/09/17   Minna AntisPaduchowski, Kevin, MD  Carbamide Peroxide 20 % SOLN Use as directed 1 application in the mouth or throat 4 (four) times daily as needed. Patient not taking: Reported on 08/22/2017 08/04/16   Kem Boroughsriplett, Cari B, FNP  etodolac (LODINE) 200 MG capsule Take 1 capsule (200 mg total) every 8 (eight) hours by mouth. Patient not taking: Reported on 08/22/2017 08/07/17   Rebecka ApleyWebster, Allison P, MD  HYDROcodone-acetaminophen [facility]) 5-325 MG tablet Take 1 tablet by mouth 3 (three) times daily as needed. Patient not taking:  Reported on 08/22/2017 12/26/16   Menshew, Charlesetta IvoryJenise V Bacon, PA-C  ibuprofen (ADVIL,MOTRIN) 800 MG tablet Take 1 tablet (800 mg total) by mouth every 8 (eight) hours as needed. Patient not taking: Reported on 08/22/2017 08/13/16   Evon SlackGaines, Thomas C, PA-C  ketoconazole (NIZORAL) 2 % cream Apply 1 application topically daily. Patient not taking: Reported on 08/22/2017 01/23/16   Hagler, Jami L, PA-C  lidocaine (LIDODERM) 5 % Place 1 patch every 12 (twelve) hours onto the skin. Remove & Discard patch within 12 hours or as directed by MD Patient not taking: Reported on 08/22/2017 08/07/17 08/07/18  Rebecka ApleyWebster, Allison P, MD  lidocaine (XYLOCAINE) 2 % solution Use as directed 20 mLs in the mouth or throat as needed for mouth pain. Patient not taking: Reported on 08/22/2017 12/27/15   Evangeline DakinBeers, Charles M, PA-C  ondansetron (ZOFRAN ODT) 4 MG disintegrating tablet Take 1 tablet (4 mg total) by mouth every 8 (eight) hours as needed for nausea or vomiting. Patient not taking: Reported on 12/09/2017 10/09/17   Minna AntisPaduchowski, Kevin, MD  pantoprazole (PROTONIX) 40 MG tablet Take 1 tablet (40 mg total) by mouth daily. Patient not taking: Reported on 12/09/2017 10/09/17 10/09/18  Minna AntisPaduchowski, Kevin, MD  penicillin v potassium (VEETID) 500 MG tablet Take 1 tablet (500 mg total) by mouth 4 (four) times daily. Patient not taking: Reported on 08/22/2017 05/14/17   Menshew, Charlesetta IvoryJenise V Bacon, PA-C  predniSONE (STERAPRED UNI-PAK 21 TAB) 10 MG (21) TBPK  tablet Take 6 tablets on day 1. Take 5 tablets on day 2. Take 4 tablets on day 3. Take 3 tablets on day 4. Take 2 tablets on day 5. Take 1 tablets on day 6. Patient not taking: Reported on 08/22/2017 03/20/17   Little, Traci M, PA-C    Allergies Tylenol [acetaminophen]  History reviewed. No pertinent family history.  Social History Social History   Tobacco Use  . Smoking status: Current Some Day Smoker    Packs/day: 0.10    Types: Cigarettes  . Smokeless tobacco: Never Used  Substance  Use Topics  . Alcohol use: No  . Drug use: Yes    Types: Marijuana    Review of Systems  Constitutional: No fever/chills Eyes: No visual changes. ENT: No sore throat. Cardiovascular: Denies chest pain. Respiratory: Denies shortness of breath. Gastrointestinal: No abdominal pain.   nausea,  vomiting.  No diarrhea.  No constipation. Genitourinary: Negative for dysuria. Musculoskeletal: Negative for back pain. Skin: Negative for rash. Neurological: Negative for headaches, focal weakness   ____________________________________________   PHYSICAL EXAM:  VITAL SIGNS: ED Triage Vitals  Enc Vitals Group     BP 12/09/17 0818 130/86     Pulse Rate 12/09/17 0818 69     Resp 12/09/17 0818 18     Temp 12/09/17 0818 97.9 F (36.6 C)     Temp Source 12/09/17 0818 Oral     SpO2 12/09/17 0818 97 %     Weight 12/09/17 0818 170 lb (77.1 kg)     Height 12/09/17 0818 5\' 11"  (1.803 m)     Head Circumference --      Peak Flow --      Pain Score 12/09/17 0817 10     Pain Loc --      Pain Edu? --      Excl. in GC? --     Constitutional: Alert and oriented. Well appearing and in no acute distress. Eyes: Conjunctivae are normal. PERRL. EOMI.fundi look normal Head: Atraumatic. Nose: No congestion/rhinnorhea. Mouth/Throat: Mucous membranes are moist.  Oropharynx non-erythematous. Neck: No stridor.  Cardiovascular: Normal rate, regular rhythm. Grossly normal heart sounds.  Good peripheral circulation. Respiratory: Normal respiratory effort.  No retractions. Lungs CTAB. Gastrointestinal: Soft and nontender. No distention. No abdominal bruits. No CVA tenderness. Musculoskeletal: No lower extremity tenderness nor edema.  No joint effusions. Neurologic:  Normal speech and language. No gross focal neurologic deficits are appreciated. No gait instability.cranial nerves II through XII are intact although visual fields and acuitywere not checked there is no nystagmuscerebellar finger-nose and rapid  alternating movements in the fingers are normal C strain and sensation are intact throughout Skin:  Skin is warm, dry and intact. No rash noted. Psychiatric: Mood and affect are normal. Speech and behavior are normal With the patient is very irritated and I'm only giving him Zofran and fluids and not doing anything else about the dizziness he feels on the right side of his head.  ____________________________________________   LABS (all labs ordered are listed, but only abnormal results are displayed)  Labs Reviewed  COMPREHENSIVE METABOLIC PANEL - Abnormal; Notable for the following components:      Result Value   Glucose, Bld 114 (*)    ALT 16 (*)    All other components within normal limits  CBC - Abnormal; Notable for the following components:   RDW 16.6 (*)    All other components within normal limits  URINALYSIS, COMPLETE (UACMP) WITH MICROSCOPIC - Abnormal; Notable for the  following components:   Color, Urine YELLOW (*)    APPearance HAZY (*)    Squamous Epithelial / LPF 0-5 (*)    All other components within normal limits  LIPASE, BLOOD  URINE DRUG SCREEN, QUALITATIVE ([facility] ONLY)   ____________________________________________  EKG   ____________________________________________  RADIOLOGY  ED MD interpretation:    Official radiology report(s): No results found.  ____________________________________________   PROCEDURES  Procedure(s) performed:  Procedures  Critical Care performed:  ____________________________________________   INITIAL IMPRESSION / ASSESSMENT AND PLAN / ED COURSE  when I tell the patient that I will give him fluids and Zofran he says he's already had that done over and over again both here and at [facility] and he wants to know why he is having dizziness on the right side of his head that started after he had the MRI last month. He said this is both she and he's got Aleve and go to [facility] or somewhere else and get this thing out of his arm. he is  leaving AMA. I cannot convince him that he did not have an MRI here or [facility] a month ago as there is nothing in the records at [facility] or here and I've refreshed him over and over again that shows he's had anything. He had a CT of the head here several years ago that was normal.         ____________________________________________   FINAL CLINICAL IMPRESSION(S) / ED DIAGNOSES  Final diagnoses:  Dizziness     ED Discharge Orders    None       Note:  This document was prepared using Dragon voice recognition software and may include unintentional dictation errors.    Arnaldo Natal, MD 12/09/17 1135

## 2017-12-09 NOTE — ED Notes (Addendum)
Patient left ED AMA, refused to sign AMA form. ED provider aware.

## 2018-01-16 ENCOUNTER — Encounter: Payer: Self-pay | Admitting: Emergency Medicine

## 2018-01-16 ENCOUNTER — Emergency Department: Payer: Self-pay

## 2018-01-16 ENCOUNTER — Emergency Department
Admission: EM | Admit: 2018-01-16 | Discharge: 2018-01-16 | Disposition: A | Discharge status: Home / Self Care | Payer: Self-pay | Attending: Emergency Medicine | Admitting: Emergency Medicine

## 2018-01-16 DIAGNOSIS — N200 Calculus of kidney: Secondary | ICD-10-CM | POA: Insufficient documentation

## 2018-01-16 DIAGNOSIS — F17210 Nicotine dependence, cigarettes, uncomplicated: Secondary | ICD-10-CM | POA: Insufficient documentation

## 2018-01-16 LAB — CBC WITH DIFFERENTIAL/PLATELET
BASOS ABS: 0 10*3/uL (ref 0–0.1)
BASOS PCT: 0 %
Eosinophils Absolute: 0.1 10*3/uL (ref 0–0.7)
Eosinophils Relative: 1 %
HCT: 41.7 % (ref 40.0–52.0)
HEMOGLOBIN: 13.9 g/dL (ref 13.0–18.0)
Lymphocytes Relative: 19 %
Lymphs Abs: 1.5 10*3/uL (ref 1.0–3.6)
MCH: 29.9 pg (ref 26.0–34.0)
MCHC: 33.4 g/dL (ref 32.0–36.0)
MCV: 89.5 fL (ref 80.0–100.0)
Monocytes Absolute: 0.4 10*3/uL (ref 0.2–1.0)
Monocytes Relative: 5 %
NEUTROS PCT: 75 %
Neutro Abs: 5.9 10*3/uL (ref 1.4–6.5)
PLATELETS: 239 10*3/uL (ref 150–440)
RBC: 4.66 MIL/uL (ref 4.40–5.90)
RDW: 17 % — ABNORMAL HIGH (ref 11.5–14.5)
WBC: 7.9 10*3/uL (ref 3.8–10.6)

## 2018-01-16 LAB — BASIC METABOLIC PANEL
Anion gap: 8 (ref 5–15)
BUN: 12 mg/dL (ref 6–20)
CHLORIDE: 107 mmol/L (ref 101–111)
CO2: 25 mmol/L (ref 22–32)
Calcium: 9.4 mg/dL (ref 8.9–10.3)
Creatinine, Ser: 1.1 mg/dL (ref 0.61–1.24)
Glucose, Bld: 120 mg/dL — ABNORMAL HIGH (ref 65–99)
POTASSIUM: 3.4 mmol/L — AB (ref 3.5–5.1)
Sodium: 140 mmol/L (ref 135–145)

## 2018-01-16 LAB — URINALYSIS, COMPLETE (UACMP) WITH MICROSCOPIC
Bilirubin Urine: NEGATIVE
Glucose, UA: NEGATIVE mg/dL
KETONES UR: 5 mg/dL — AB
LEUKOCYTES UA: NEGATIVE
Nitrite: NEGATIVE
PROTEIN: 30 mg/dL — AB
SPECIFIC GRAVITY, URINE: 1.027 (ref 1.005–1.030)
pH: 5 (ref 5.0–8.0)

## 2018-01-16 MED ORDER — TRAMADOL HCL 50 MG PO TABS: 50.0000 mg | ORAL_TABLET | Freq: Two times a day (BID) | ORAL | 0 refills | Status: DC | PRN

## 2018-01-16 NOTE — Discharge Instructions (Signed)
Increase water intake and take medication as needed for pain.

## 2018-01-16 NOTE — ED Provider Notes (Signed)
[facility] Emergency Department Provider Note   ____________________________________________   First MD Initiated Contact with Patient 01/16/18 1148     (approximate)  I have reviewed the triage vital signs and the nursing notes.   HISTORY  Chief Complaint Flank Pain    HPI Anthony Levine is a 42 y.o. male patient complaining of left flank pain intermittently for 1 week.  Patient noticed 2 episodes of hematuria this morning.  Patient concern for kidney stone since he was told on a CT scan prior to 6 months ago they had a nonobstructing stone in the right kidney.  Patient denies urethral discharge or dysuria.  Patient rates pain as a 7/10.  Patient described the pain is "throbbing".  No palliative measures for complaint.  Past Medical History:  Diagnosis Date  . Blood clot in abdominal vein     There are no active problems to display for this patient.   Past Surgical History:  Procedure Laterality Date  . blood clot removal from abdomen      Prior to Admission medications   Medication Sig Start Date End Date Taking? Authorizing Provider  alum & mag hydroxide-simeth (MAALOX REGULAR STRENGTH) 200-200-20 MG/5ML suspension Take 30 mLs by mouth every 4 (four) hours as needed for indigestion or heartburn. Patient not taking: Reported on 12/09/2017 10/09/17   Minna Antis, MD  Carbamide Peroxide 20 % SOLN Use as directed 1 application in the mouth or throat 4 (four) times daily as needed. Patient not taking: Reported on 08/22/2017 08/04/16   Kem Boroughs B, FNP  etodolac (LODINE) 200 MG capsule Take 1 capsule (200 mg total) every 8 (eight) hours by mouth. Patient not taking: Reported on 08/22/2017 08/07/17   Rebecka Apley, MD  HYDROcodone-acetaminophen [facility]) 5-325 MG tablet Take 1 tablet by mouth 3 (three) times daily as needed. Patient not taking: Reported on 08/22/2017 12/26/16   Menshew, Charlesetta Ivory, PA-C  ibuprofen  (ADVIL,MOTRIN) 800 MG tablet Take 1 tablet (800 mg total) by mouth every 8 (eight) hours as needed. Patient not taking: Reported on 08/22/2017 08/13/16   Evon Slack, PA-C  ketoconazole (NIZORAL) 2 % cream Apply 1 application topically daily. Patient not taking: Reported on 08/22/2017 01/23/16   Hagler, Jami L, PA-C  lidocaine (LIDODERM) 5 % Place 1 patch every 12 (twelve) hours onto the skin. Remove & Discard patch within 12 hours or as directed by MD Patient not taking: Reported on 08/22/2017 08/07/17 08/07/18  Rebecka Apley, MD  lidocaine (XYLOCAINE) 2 % solution Use as directed 20 mLs in the mouth or throat as needed for mouth pain. Patient not taking: Reported on 08/22/2017 12/27/15   Evangeline Dakin, PA-C  ondansetron (ZOFRAN ODT) 4 MG disintegrating tablet Take 1 tablet (4 mg total) by mouth every 8 (eight) hours as needed for nausea or vomiting. Patient not taking: Reported on 12/09/2017 10/09/17   Minna Antis, MD  pantoprazole (PROTONIX) 40 MG tablet Take 1 tablet (40 mg total) by mouth daily. Patient not taking: Reported on 12/09/2017 10/09/17 10/09/18  Minna Antis, MD  penicillin v potassium (VEETID) 500 MG tablet Take 1 tablet (500 mg total) by mouth 4 (four) times daily. Patient not taking: Reported on 08/22/2017 05/14/17   Menshew, Charlesetta Ivory, PA-C  predniSONE (STERAPRED UNI-PAK 21 TAB) 10 MG (21) TBPK tablet Take 6 tablets on day 1. Take 5 tablets on day 2. Take 4 tablets on day 3. Take 3 tablets on day 4. Take 2  tablets on day 5. Take 1 tablets on day 6. Patient not taking: Reported on 08/22/2017 03/20/17   Little, Traci M, PA-C  traMADol (ULTRAM) 50 MG tablet Take 1 tablet (50 mg total) by mouth every 12 (twelve) hours as needed. 01/16/18   Joni ReiningSmith, Haeli Gerlich K, PA-C    Allergies Tylenol [acetaminophen]  No family history on file.  Social History Social History   Tobacco Use  . Smoking status: Current Some Day Smoker    Packs/day: 0.10    Types: Cigarettes  .  Smokeless tobacco: Never Used  Substance Use Topics  . Alcohol use: No  . Drug use: Yes    Types: Marijuana    Review of Systems Constitutional: No fever/chills Eyes: No visual changes. ENT: No sore throat. Cardiovascular: Denies chest pain. Respiratory: Denies shortness of breath. Gastrointestinal: No abdominal pain.  No nausea, no vomiting.  No diarrhea.  No constipation. Genitourinary: Negative for dysuria. Musculoskeletal: Right flank pain skin: Negative for rash. Neurological: Negative for headaches, focal weakness or numbness. Allergic/Immunilogical: Tylenol ____________________________________________   PHYSICAL EXAM:  VITAL SIGNS: ED Triage Vitals  Enc Vitals Group     BP 01/16/18 1142 (!) 142/91     Pulse Rate 01/16/18 1142 75     Resp 01/16/18 1142 15     Temp 01/16/18 1142 98.7 F (37.1 C)     Temp Source 01/16/18 1142 Oral     SpO2 01/16/18 1142 100 %     Weight 01/16/18 1144 175 lb (79.4 kg)     Height 01/16/18 1144 5\' 11"  (1.803 m)     Head Circumference --      Peak Flow --      Pain Score 01/16/18 1142 7     Pain Loc --      Pain Edu? --      Excl. in GC? --     Constitutional: Alert and oriented. Well appearing and in no acute distress. Cardiovascular: Normal rate, regular rhythm. Grossly normal heart sounds.  Good peripheral circulation. Respiratory: Normal respiratory effort.  No retractions. Lungs CTAB. Gastrointestinal: Soft and nontender. No distention. No abdominal bruits. No CVA tenderness. Genitourinary: No external lesions. Musculoskeletal: No lower extremity tenderness nor edema.  No joint effusions. Neurologic:  Normal speech and language. No gross focal neurologic deficits are appreciated. No gait instability. Skin:  Skin is warm, dry and intact. No rash noted. Psychiatric: Mood and affect are normal. Speech and behavior are normal.  ____________________________________________   LABS (all labs ordered are listed, but only abnormal  results are displayed)  Labs Reviewed  URINALYSIS, COMPLETE (UACMP) WITH MICROSCOPIC - Abnormal; Notable for the following components:      Result Value   Color, Urine YELLOW (*)    APPearance HAZY (*)    Hgb urine dipstick LARGE (*)    Ketones, ur 5 (*)    Protein, ur 30 (*)    Bacteria, UA RARE (*)    Squamous Epithelial / LPF 0-5 (*)    All other components within normal limits  CBC WITH DIFFERENTIAL/PLATELET - Abnormal; Notable for the following components:   RDW 17.0 (*)    All other components within normal limits  BASIC METABOLIC PANEL - Abnormal; Notable for the following components:   Potassium 3.4 (*)    Glucose, Bld 120 (*)    All other components within normal limits   ____________________________________________  EKG   ____________________________________________  RADIOLOGY  ED MD interpretation:    Official radiology report(s): Ct Renal Stone  Study  Result Date: 01/16/2018 CLINICAL DATA:  Flank pain with hematuria EXAM: CT ABDOMEN AND PELVIS WITHOUT CONTRAST TECHNIQUE: Multidetector CT imaging of the abdomen and pelvis was performed following the standard protocol without IV contrast. COMPARISON:  CT abdomen pelvis 08/22/2017 FINDINGS: Lower chest: Lung bases clear.  Heart size within normal limits. Hepatobiliary: Unenhanced images of the liver are negative. Normal gallbladder and bile ducts. Pancreas: Negative Spleen: Negative Adrenals/Urinary Tract: 2 x 4 mm nonobstructing stone right midpole. No left renal calculi. No ureteral obstruction or stone. Normal urinary bladder which is partially filled. Stomach/Bowel: Negative for bowel obstruction. No bowel mass or edema. Limited bowel evaluation due to lack of IV and PO contrast and lack of body fat. Appendix not visualized. Vascular/Lymphatic: Mild atherosclerotic calcification. No aortic aneurysm. Reproductive: Mild to moderate prostate enlargement. Other: No free fluid or hernia Musculoskeletal: None IMPRESSION: 2 x  4 mm nonobstructing stone right kidney. No ureteral obstruction or stone. No acute abnormality Mild atherosclerotic disease Prostate enlargement Electronically Signed   By: Marlan Palau M.D.   On: 01/16/2018 13:14    ____________________________________________   PROCEDURES  Procedure(s) performed: None  Procedures  Critical Care performed: No  ____________________________________________   INITIAL IMPRESSION / ASSESSMENT AND PLAN / ED COURSE  As part of my medical decision making, I reviewed the following data within the electronic MEDICAL RECORD NUMBER   Right flank pain and hematuria.  Differential consist renal calculus and urinary tract infection. Discussed CT results revealing nonobstructive right renal calculus.  Patient given discharge care instructions.  Patient advised to follow urology if no improvement or worsening complaint in the next 3-5 days.  Take pain medication as needed.      ____________________________________________   FINAL CLINICAL IMPRESSION(S) / ED DIAGNOSES  Final diagnoses:  Nephrolithiasis     ED Discharge Orders        Ordered    traMADol (ULTRAM) 50 MG tablet  Every 12 hours PRN     01/16/18 1330       Note:  This document was prepared using Dragon voice recognition software and may include unintentional dictation errors.    Joni Reining, PA-C 01/16/18 1342    Emily Filbert, MD 01/16/18 [phone]

## 2018-01-16 NOTE — ED Triage Notes (Signed)
Pt arrived with complaint of flank pain that started last Monday. Pt states the pain has been intermittent. Today patient had two episodes of blood in urine which caused him to be concerned and wanted to be evaluated for a kidney stone.

## 2018-06-07 ENCOUNTER — Emergency Department
Admission: EM | Admit: 2018-06-07 | Discharge: 2018-06-07 | Disposition: A | Discharge status: Home / Self Care | Payer: Self-pay | Attending: Emergency Medicine | Admitting: Emergency Medicine

## 2018-06-07 ENCOUNTER — Other Ambulatory Visit: Payer: Self-pay

## 2018-06-07 DIAGNOSIS — M5432 Sciatica, left side: Secondary | ICD-10-CM | POA: Insufficient documentation

## 2018-06-07 DIAGNOSIS — R42 Dizziness and giddiness: Secondary | ICD-10-CM | POA: Insufficient documentation

## 2018-06-07 DIAGNOSIS — F17210 Nicotine dependence, cigarettes, uncomplicated: Secondary | ICD-10-CM | POA: Insufficient documentation

## 2018-06-07 MED ORDER — ONDANSETRON 4 MG PO TBDP: 4.0000 mg | ORAL_TABLET | Freq: Three times a day (TID) | ORAL | 0 refills | Status: DC | PRN

## 2018-06-07 MED ORDER — IMIQUIMOD 5 % EX CREA: TOPICAL_CREAM | CUTANEOUS | 1 refills | Status: DC

## 2018-06-07 MED ORDER — SCOPOLAMINE 1 MG/3DAYS TD PT72: 1.0000 | MEDICATED_PATCH | TRANSDERMAL | 1 refills | Status: AC

## 2018-06-07 MED ORDER — NAPROXEN 500 MG PO TABS: 500.0000 mg | ORAL_TABLET | Freq: Two times a day (BID) | ORAL | 2 refills | Status: DC

## 2018-06-07 NOTE — ED Provider Notes (Addendum)
[facility] Emergency Department Provider Note   ____________________________________________    I have reviewed the triage vital signs and the nursing notes.   HISTORY  Chief Complaint Dizziness and Back Pain     HPI Anthony Levine is a 42 y.o. male who presents with 2 complaints.  Patient reports he has had intermittent dizziness over the last 6 months, last episode was 2 days ago.  He describes it as a sensation of the room spinning consistent with vertigo.  Occasionally has a headache with this.  No recent head injury.  Will take Zofran with improvement of symptoms.  He also complains of left-sided low back pain with "electric" sensations radiating into his left leg.  No motor weakness or numbness.  No groin anesthesia.  No IV drug abuse.  He does use marijuana several times a week   Past Medical History:  Diagnosis Date  . Blood clot in abdominal vein     There are no active problems to display for this patient.   Past Surgical History:  Procedure Laterality Date  . blood clot removal from abdomen      Prior to Admission medications   Medication Sig Start Date End Date Taking? Authorizing Provider  imiquimod Mathis Dad) 5 % cream Apply to affected area three times weekly 06/07/18 06/07/19  Jene Every, MD  naproxen (NAPROSYN) 500 MG tablet Take 1 tablet (500 mg total) by mouth 2 (two) times daily with a meal. 06/07/18   Jene Every, MD  ondansetron (ZOFRAN ODT) 4 MG disintegrating tablet Take 1 tablet (4 mg total) by mouth every 8 (eight) hours as needed for nausea or vomiting. 06/07/18   Jene Every, MD  scopolamine (TRANSDERM-SCOP, 1.5 MG,) 1 MG/3DAYS Place 1 patch (1.5 mg total) onto the skin every 3 (three) days for 24 days. 06/07/18 07/01/18  Jene Every, MD     Allergies Tylenol [acetaminophen]  No family history on file.  Social History Social History   Tobacco Use  . Smoking status: Current Some Day Smoker   Packs/day: 0.10    Types: Cigarettes  . Smokeless tobacco: Never Used  Substance Use Topics  . Alcohol use: No  . Drug use: Yes    Types: Marijuana    Review of Systems  Constitutional: No fever/chills Eyes: No visual changes.  ENT: No neck pain Cardiovascular: Denies chest pain. Respiratory: Denies shortness of breath. Gastrointestinal: No abdominal pain.  Genitourinary: Negative for dysuria. Musculoskeletal: As above Skin: Negative for rash. Neurological: As above   ____________________________________________   PHYSICAL EXAM:  VITAL SIGNS: ED Triage Vitals  Enc Vitals Group     BP 06/07/18 0833 (!) 189/87     Pulse Rate 06/07/18 0833 71     Resp 06/07/18 0833 16     Temp 06/07/18 0833 98.4 F (36.9 C)     Temp Source 06/07/18 0833 Oral     SpO2 06/07/18 0833 99 %     Weight 06/07/18 0831 79.4 kg (175 lb)     Height 06/07/18 0831 1.803 m (5\' 11" )     Head Circumference --      Peak Flow --      Pain Score 06/07/18 0831 10     Pain Loc --      Pain Edu? --      Excl. in GC? --     Constitutional: Alert and oriented.  Eyes: Conjunctivae are normal.  EOMI, PERRLA Head: Atraumatic.  Neck:  Painless ROM Cardiovascular: Normal  rate, regular rhythm. Grossly normal heart sounds.  Good peripheral circulation. Respiratory: Normal respiratory effort.  No retractions.  Gastrointestinal: Soft and nontender. No distention  Musculoskeletal: No lower extremity tenderness nor edema.  Normal strength in all extremities. warm and well perfused Neurologic:  Normal speech and language. No gross focal neurologic deficits are appreciated.  Cranial nerves II through XII are normal Skin:  Skin is warm, dry and intact. No rash noted. Psychiatric: Mood and affect are normal. Speech and behavior are normal.  ____________________________________________   LABS (all labs ordered are listed, but only abnormal results are displayed)  Labs Reviewed - No data to  display ____________________________________________  EKG  ED ECG REPORT I, Jene Every, the attending physician, personally viewed and interpreted this ECG.  Date: 06/07/2018  Rhythm: normal sinus rhythm QRS Axis: normal Intervals: normal ST/T Wave abnormalities: normal Narrative Interpretation: no evidence of acute ischemia  ____________________________________________  RADIOLOGY  None ____________________________________________   PROCEDURES  Procedure(s) performed: No  Procedures   Critical Care performed: No ____________________________________________   INITIAL IMPRESSION / ASSESSMENT AND PLAN / ED COURSE  Pertinent labs & imaging results that were available during my care of the patient were reviewed by me and considered in my medical decision making (see chart for details).  Patient well-appearing and in no acute distress.  Symptoms are consistent with episodic vertigo.  Will Rx scopolamine patch and Zofran for this.  Naprosyn for what sounds like sciatica.  Will refer to neurology for further evaluation of vertigo   ____________________________________________   FINAL CLINICAL IMPRESSION(S) / ED DIAGNOSES  Final diagnoses:  Vertigo  Sciatica of left side        Note:  This document was prepared using Dragon voice recognition software and may include unintentional dictation errors.    Jene Every, MD 06/07/18 1041    Jene Every, MD 06/07/18 1041

## 2018-06-07 NOTE — ED Triage Notes (Signed)
Pt c/o left lower back pain for several months that radiates into the leg. Pt also c/o intermittent dizziness with N/V since having MRI in [place]

## 2018-06-07 NOTE — ED Notes (Signed)
Pt stating vertigo for "years" that has become worse recently. Pt also stating left buttocks pain shooting down left posterior leg. Pt denies any urinary or BM issues. Pt in NAD at this time and Dr. Cyril Loosen has assessed pt already at this time.

## 2018-08-04 ENCOUNTER — Emergency Department
Admission: EM | Admit: 2018-08-04 | Discharge: 2018-08-04 | Disposition: A | Discharge status: Home / Self Care | Payer: Self-pay | Attending: Emergency Medicine | Admitting: Emergency Medicine

## 2018-08-04 ENCOUNTER — Encounter: Payer: Self-pay | Admitting: Emergency Medicine

## 2018-08-04 ENCOUNTER — Other Ambulatory Visit: Payer: Self-pay

## 2018-08-04 DIAGNOSIS — Y929 Unspecified place or not applicable: Secondary | ICD-10-CM | POA: Insufficient documentation

## 2018-08-04 DIAGNOSIS — Y93G1 Activity, food preparation and clean up: Secondary | ICD-10-CM | POA: Insufficient documentation

## 2018-08-04 DIAGNOSIS — Y998 Other external cause status: Secondary | ICD-10-CM | POA: Insufficient documentation

## 2018-08-04 DIAGNOSIS — F1210 Cannabis abuse, uncomplicated: Secondary | ICD-10-CM | POA: Insufficient documentation

## 2018-08-04 DIAGNOSIS — W260XXA Contact with knife, initial encounter: Secondary | ICD-10-CM | POA: Insufficient documentation

## 2018-08-04 DIAGNOSIS — F17210 Nicotine dependence, cigarettes, uncomplicated: Secondary | ICD-10-CM | POA: Insufficient documentation

## 2018-08-04 DIAGNOSIS — S61412A Laceration without foreign body of left hand, initial encounter: Secondary | ICD-10-CM | POA: Insufficient documentation

## 2018-08-04 MED ORDER — LIDOCAINE HCL (PF) 1 % IJ SOLN
5.0000 mL | Freq: Once | INTRAMUSCULAR | Status: AC
  Administered 2018-08-04: 5 mL via INTRADERMAL
  Filled 2018-08-04: qty 5

## 2018-08-04 MED ORDER — BACITRACIN-NEOMYCIN-POLYMYXIN 400-5-5000 EX OINT
TOPICAL_OINTMENT | Freq: Once | CUTANEOUS | Status: DC
  Filled 2018-08-04: qty 1

## 2018-08-04 MED ORDER — CEPHALEXIN 500 MG PO CAPS: 500.0000 mg | ORAL_CAPSULE | Freq: Four times a day (QID) | ORAL | 0 refills | Status: AC

## 2018-08-04 MED ORDER — IMIQUIMOD 5 % EX CREA: TOPICAL_CREAM | CUTANEOUS | 0 refills | Status: AC

## 2018-08-04 NOTE — ED Triage Notes (Signed)
Presents with laceration to left hand  States he was cleaning a fish last pm around 7p  Knife slipped laceration noted to web space   Also here for rash to face  States he was given rx for cream for face but is currently out of

## 2018-08-04 NOTE — ED Provider Notes (Signed)
[facility] Emergency Department Provider Note  ____________________________________________  Time seen: Approximately 9:57 AM  I have reviewed the triage vital signs and the nursing notes.   HISTORY  Chief Complaint Laceration    HPI Deshon Alver Leete is a 42 y.o. male that presents to the emergency department for evaluation of laceration to left hand 13 hours ago.  Patient was cleaning fish last night when the knife slipped.  Last tetanus shot was 2 years ago.  Past Medical History:  Diagnosis Date  . Blood clot in abdominal vein     There are no active problems to display for this patient.   Past Surgical History:  Procedure Laterality Date  . blood clot removal from abdomen      Prior to Admission medications   Medication Sig Start Date End Date Taking? Authorizing Provider  cephALEXin (KEFLEX) 500 MG capsule Take 1 capsule (500 mg total) by mouth 4 (four) times daily for 10 days. 08/04/18 08/14/18  Enid Derry, PA-C  imiquimod Mathis Dad) 5 % cream Apply to affected area three times weekly 08/04/18 08/04/19  Enid Derry, PA-C  naproxen (NAPROSYN) 500 MG tablet Take 1 tablet (500 mg total) by mouth 2 (two) times daily with a meal. 06/07/18   Jene Every, MD  ondansetron (ZOFRAN ODT) 4 MG disintegrating tablet Take 1 tablet (4 mg total) by mouth every 8 (eight) hours as needed for nausea or vomiting. 06/07/18   Jene Every, MD    Allergies Tylenol [acetaminophen]  No family history on file.  Social History Social History   Tobacco Use  . Smoking status: Current Some Day Smoker    Packs/day: 0.10    Types: Cigarettes  . Smokeless tobacco: Never Used  Substance Use Topics  . Alcohol use: No  . Drug use: Yes    Types: Marijuana     Review of Systems  Respiratory: No SOB. Gastrointestinal: No nausea, no vomiting.  Musculoskeletal: Negative for musculoskeletal pain. Skin: Negative for rash, ecchymosis. Positive for  laceration.  Neurological: Negative for numbness or tingling   ____________________________________________   PHYSICAL EXAM:  VITAL SIGNS: ED Triage Vitals  Enc Vitals Group     BP 08/04/18 0945 132/83     Pulse Rate 08/04/18 0945 70     Resp 08/04/18 0945 14     Temp 08/04/18 0945 97.7 F (36.5 C)     Temp Source 08/04/18 0945 Oral     SpO2 08/04/18 0945 100 %     Weight 08/04/18 0949 165 lb (74.8 kg)     Height 08/04/18 0949 5\' 11"  (1.803 m)     Head Circumference --      Peak Flow --      Pain Score --      Pain Loc --      Pain Edu? --      Excl. in GC? --      Constitutional: Alert and oriented. Well appearing and in no acute distress. Eyes: Conjunctivae are normal. PERRL. EOMI. Head: Atraumatic. ENT:      Ears:      Nose: No congestion/rhinnorhea.      Mouth/Throat: Mucous membranes are moist.  Neck: No stridor.  Cardiovascular: Normal rate, regular rhythm.  Good peripheral circulation. Respiratory: Normal respiratory effort without tachypnea or retractions. Lungs CTAB. Good air entry to the bases with no decreased or absent breath sounds. Musculoskeletal: Full range of motion to all extremities. No gross deformities appreciated.  Full range of motion of hand. Neurologic:  Normal speech and language. No gross focal neurologic deficits are appreciated.  Skin:  Skin is warm, dry.  1 cm shallow laceration to left hand between thumb and index finger. Psychiatric: Mood and affect are normal. Speech and behavior are normal. Patient exhibits appropriate insight and judgement.   ____________________________________________   LABS (all labs ordered are listed, but only abnormal results are displayed)  Labs Reviewed - No data to display ____________________________________________  EKG   ____________________________________________  RADIOLOGY   No results found.  ____________________________________________    PROCEDURES  Procedure(s) performed:     Procedures  LACERATION REPAIR Performed by: Enid Derry  Consent: Verbal consent obtained.  Consent given by: patient  Prepped and Draped in normal sterile fashion  Wound explored: No foreign bodies   Laceration Location: hand   Laceration Length: 1 cm  Anesthesia: None  Local anesthetic: lidocaine 1% without epinephrine  Anesthetic total: 2 ml  Irrigation method: syringe  Amount of cleaning: normal saline  Skin closure: 4-0 nylon  Number of sutures: 3  Technique: Simple interrupted  Patient tolerance: Patient tolerated the procedure well with no immediate complications.  Medications  lidocaine (PF) (XYLOCAINE) 1 % injection 5 mL (5 mLs Intradermal Given by Other 08/04/18 1031)     ____________________________________________   INITIAL IMPRESSION / ASSESSMENT AND PLAN / ED COURSE  Pertinent labs & imaging results that were available during my care of the patient were reviewed by me and considered in my medical decision making (see chart for details).  Review of the [place] CSRS was performed in accordance of the [facility] prior to dispensing any controlled drugs.   Patient's diagnosis is consistent with hand laceration.  Laceration was repaired with stitches.  Tetanus shot is up-to-date.  Patient will be discharged home with prescriptions for keflex. Patient is to follow up with PCP as directed. Patient is given ED precautions to return to the ED for any worsening or new symptoms.     ____________________________________________  FINAL CLINICAL IMPRESSION(S) / ED DIAGNOSES  Final diagnoses:  Laceration of left hand, foreign body presence unspecified, initial encounter      NEW MEDICATIONS STARTED DURING THIS VISIT:      This chart was dictated using voice recognition software/Dragon. Despite best efforts to proofread, errors can occur which can change the meaning. Any change was purely unintentional.    Enid Derry, PA-C 08/04/18  1411    Pershing Proud Myra Rude, MD 08/04/18 [phone]

## 2018-08-04 NOTE — ED Notes (Signed)
First Nurse Note: Pt to ED c/o laceration to the left hand. Pt states that this happened yesterday around 7pm. Pt also wanting to be seen for recurrent rash.

## 2018-08-18 IMAGING — CT CT ABD-PELV W/ CM
2 of 4 series · 16 of 46 positions shown, 18 images · IV contrast (APPLIED)
Comparison: 03/19/2008

CLINICAL DATA: Unspecified abdominal pain. Per technologist pain is
periumbilical extending towards the left.

EXAM:
CT ABDOMEN AND PELVIS WITH CONTRAST
TECHNIQUE: Multidetector CT imaging of the abdomen and pelvis was performed
using the standard protocol following bolus administration of
intravenous contrast.
CONTRAST:  100mL SP9KOB-133 IOPAMIDOL (SP9KOB-133) INJECTION 61%

[Series 2: axial st · axial · 0.64mm/px · z∈[-407,-7]mm · 13 of 88 slices shown, 15 images]
[im 4/88  soft-tissue]
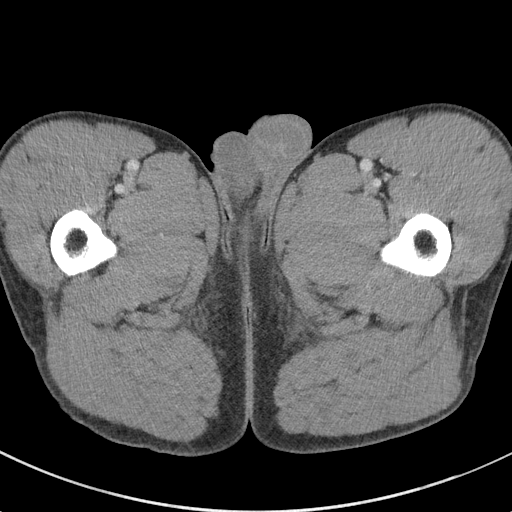
[im 4/88  bone]
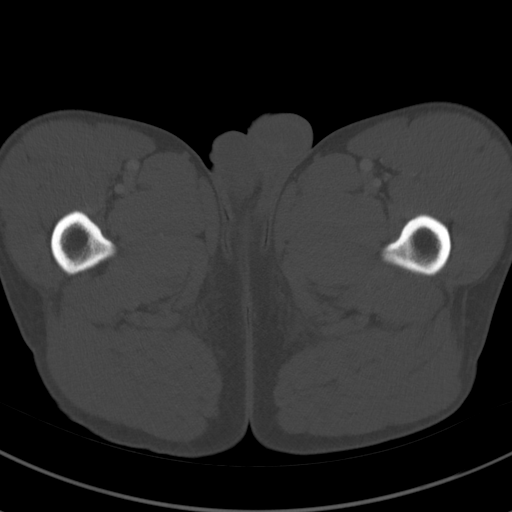
[im 11/88  soft-tissue]
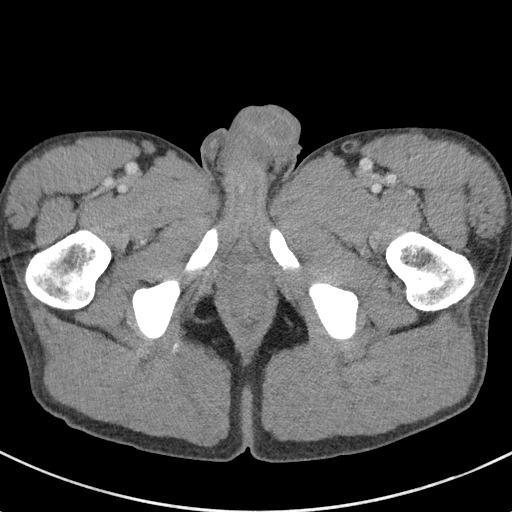
[im 18/88  soft-tissue]
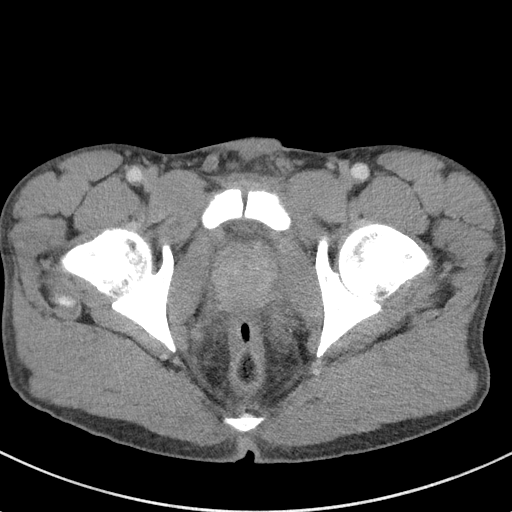
[im 25/88  soft-tissue]
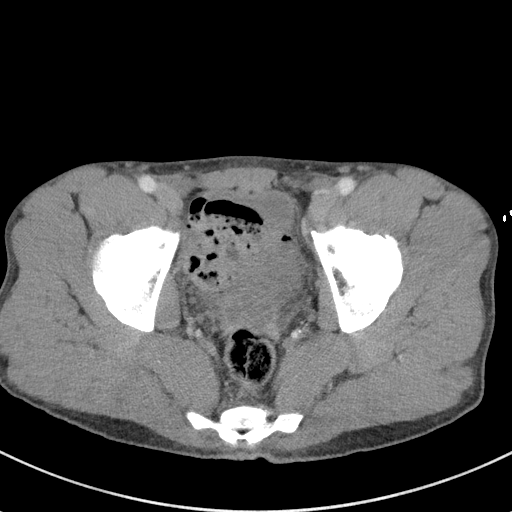
[im 32/88  soft-tissue]
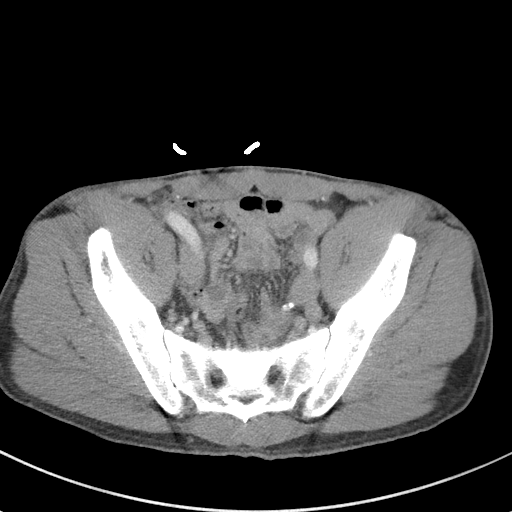
[im 39/88  soft-tissue]
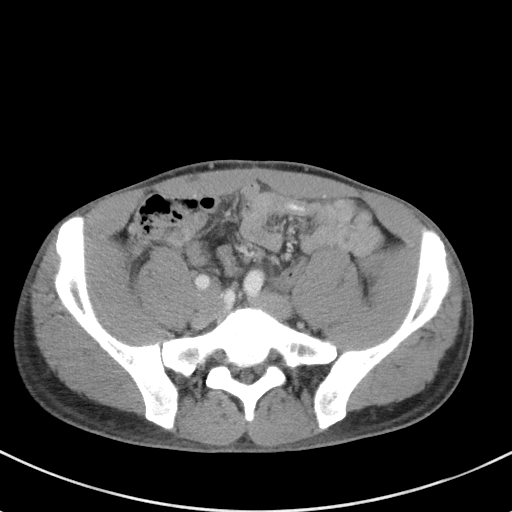
[im 46/88  soft-tissue]
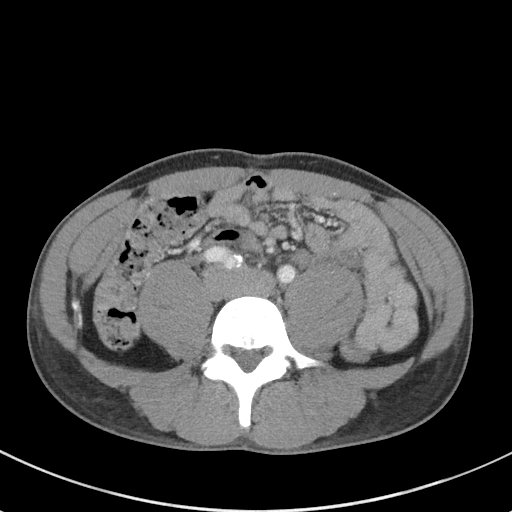
[im 49/88  soft-tissue]
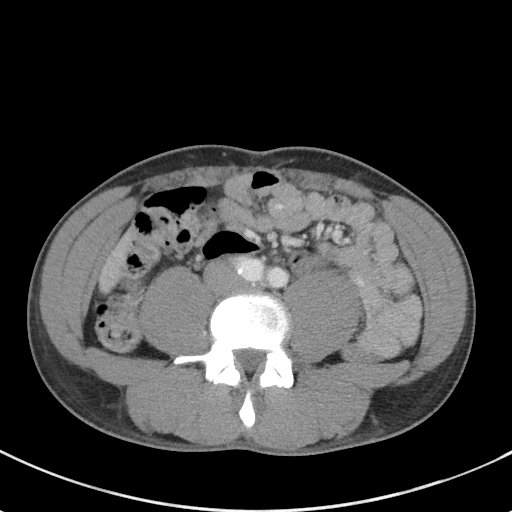
[im 56/88  soft-tissue]
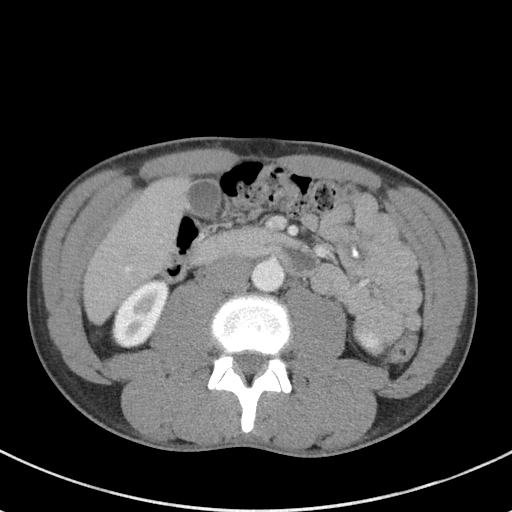
[im 56/88  bone]
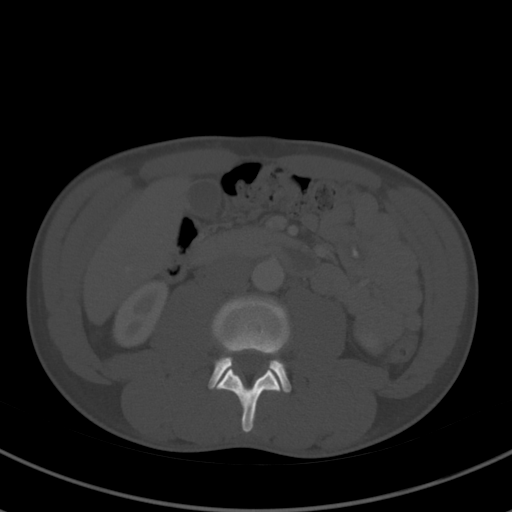
[im 63/88  soft-tissue]
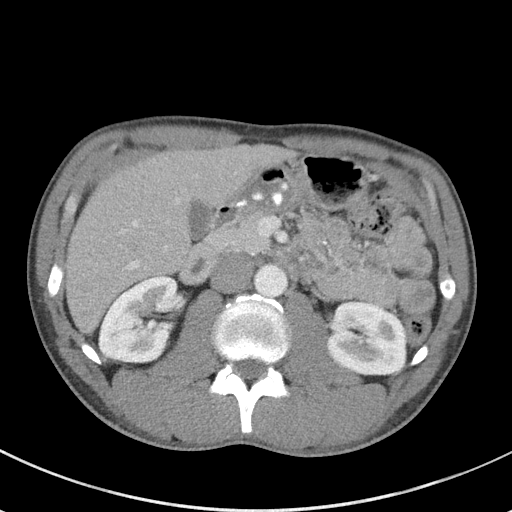
[im 70/88  soft-tissue]
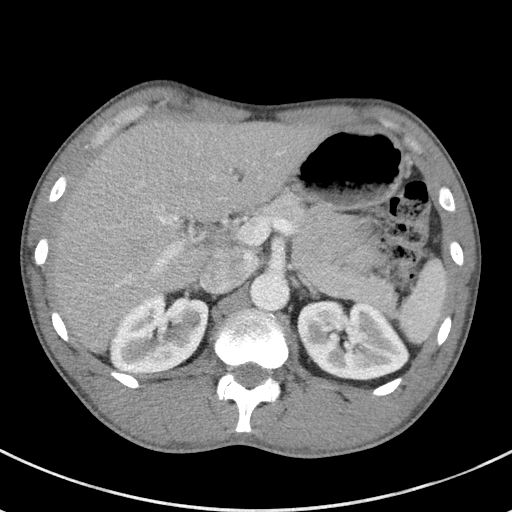
[im 77/88  soft-tissue]
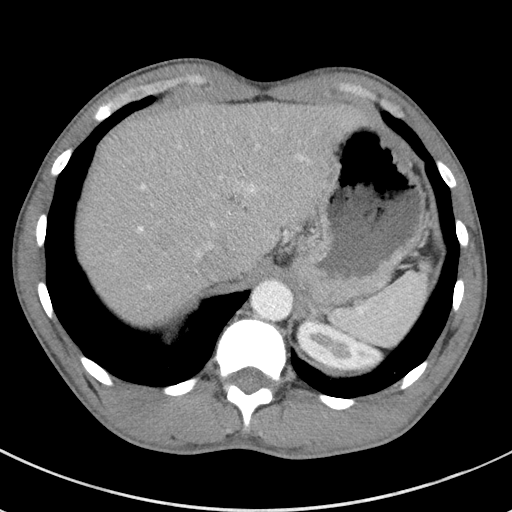
[im 84/88  soft-tissue]
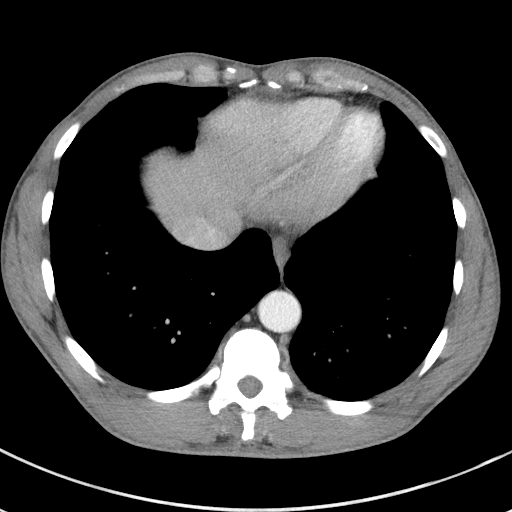

[Series 5: coronal st · coronal · 0.62mm/px · 3 of 85 slices shown]
[im 29/85  soft-tissue]
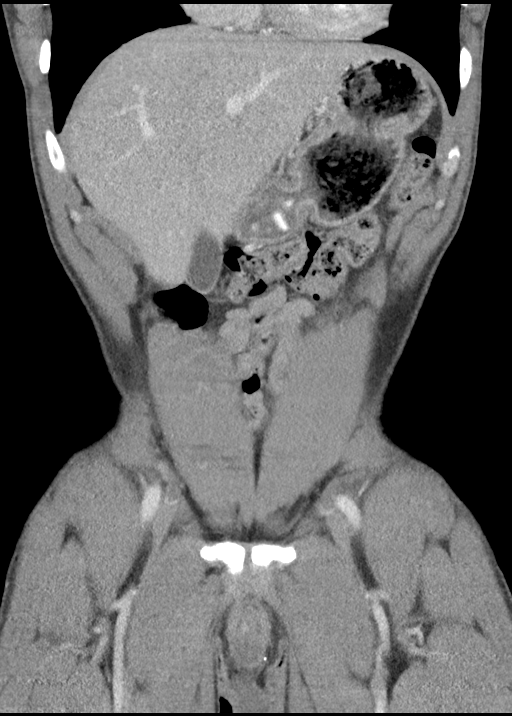
[im 38/85  soft-tissue]
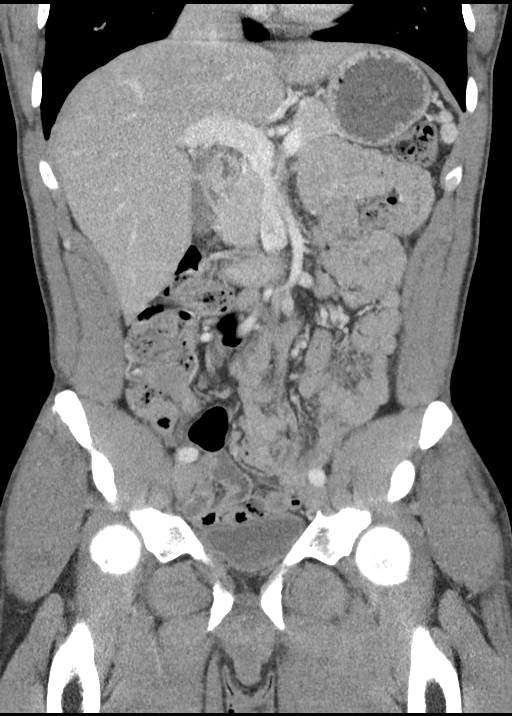
[im 47/85  soft-tissue]
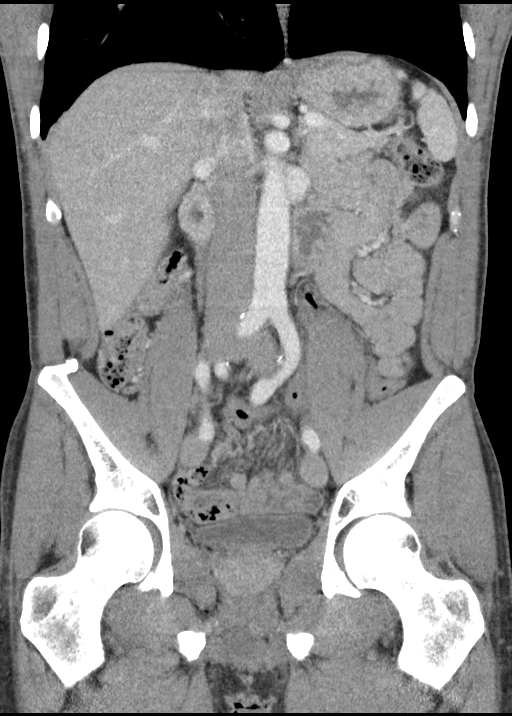

[16 of 46 positions shown; findings below may reference images not displayed]

FINDINGS: Lower chest:  No contributory findings.

Hepatobiliary: No focal liver abnormality.No evidence of biliary
obstruction or stone.

Pancreas: Unremarkable.

Spleen: Unremarkable.

Adrenals/Urinary Tract: Negative adrenals. No hydronephrosis or
ureteral stone. 3 mm right nephrolithiasis, nonobstructive.
Unremarkable bladder.

Stomach/Bowel: No obstruction. No appendicitis. Intermittent
ingested high-density material within the stomach and small bowel,
nonspecific.

Vascular/Lymphatic: No acute vascular abnormality. Atherosclerotic
plaque on the tortuous aorta and iliacs. No mass or adenopathy.

Reproductive:Negative.

Other: No ascites or pneumoperitoneum.

Musculoskeletal: No acute abnormalities.
IMPRESSION: 1. No acute finding.
2. 3 mm right renal calculus.
3. Aortoiliac atherosclerosis.

## 2018-09-05 ENCOUNTER — Other Ambulatory Visit: Payer: Self-pay

## 2018-09-05 ENCOUNTER — Encounter: Payer: Self-pay | Admitting: Emergency Medicine

## 2018-09-05 ENCOUNTER — Emergency Department
Admission: EM | Admit: 2018-09-05 | Discharge: 2018-09-05 | Disposition: A | Discharge status: Home / Self Care | Payer: Self-pay | Attending: Emergency Medicine | Admitting: Emergency Medicine

## 2018-09-05 ENCOUNTER — Emergency Department: Payer: Self-pay

## 2018-09-05 DIAGNOSIS — M5410 Radiculopathy, site unspecified: Secondary | ICD-10-CM | POA: Insufficient documentation

## 2018-09-05 DIAGNOSIS — F17210 Nicotine dependence, cigarettes, uncomplicated: Secondary | ICD-10-CM | POA: Insufficient documentation

## 2018-09-05 DIAGNOSIS — M79605 Pain in left leg: Secondary | ICD-10-CM | POA: Insufficient documentation

## 2018-09-05 DIAGNOSIS — F1210 Cannabis abuse, uncomplicated: Secondary | ICD-10-CM | POA: Insufficient documentation

## 2018-09-05 DIAGNOSIS — M25552 Pain in left hip: Secondary | ICD-10-CM | POA: Insufficient documentation

## 2018-09-05 MED ORDER — PREDNISONE 10 MG PO TABS: ORAL_TABLET | ORAL | 0 refills | Status: DC

## 2018-09-05 MED ORDER — IBUPROFEN 600 MG PO TABS: 600.0000 mg | ORAL_TABLET | Freq: Once | ORAL | Status: DC

## 2018-09-05 MED ORDER — DEXAMETHASONE SODIUM PHOSPHATE 10 MG/ML IJ SOLN
10.0000 mg | Freq: Once | INTRAMUSCULAR | Status: AC
  Administered 2018-09-05: 10 mg via INTRAMUSCULAR
  Filled 2018-09-05: qty 1

## 2018-09-05 NOTE — ED Provider Notes (Signed)
[facility] Emergency Department Provider Note   ____________________________________________   First MD Initiated Contact with Patient 09/05/18 1329     (approximate)  I have reviewed the triage vital signs and the nursing notes.   HISTORY  Chief Complaint Leg Pain   HPI Anthony Levine is a 42 y.o. male presents to the ED with complaint of left lower extremity pain from his hip down to his foot.  Patient denies any injury.  He has had this intermittently for the last 7 to 8 months.  Patient states that he looked up "sciatica" and has tried all the exercises suggested without any relief.  He is occasionally taken anti-inflammatories.  Patient is continuing to be ambulatory without any assistance.  He denies any injury to his back.  He rates his pain as 10/10.   Past Medical History:  Diagnosis Date  . Blood clot in abdominal vein     There are no active problems to display for this patient.   Past Surgical History:  Procedure Laterality Date  . blood clot removal from abdomen      Prior to Admission medications   Medication Sig Start Date End Date Taking? Authorizing Provider  imiquimod Mathis Dad) 5 % cream Apply to affected area three times weekly 08/04/18 08/04/19  Enid Derry, PA-C  predniSONE (DELTASONE) 10 MG tablet Take 6 tablets  today, on day 2 take 5 tablets, day 3 take 4 tablets, day 4 take 3 tablets, day 5 take  2 tablets and 1 tablet the last day 09/05/18   Tommi Rumps, PA-C    Allergies Tylenol [acetaminophen]  No family history on file.  Social History Social History   Tobacco Use  . Smoking status: Current Some Day Smoker    Packs/day: 0.10    Types: Cigarettes  . Smokeless tobacco: Never Used  Substance Use Topics  . Alcohol use: No  . Drug use: Yes    Types: Marijuana    Review of Systems Constitutional: No fever/chills Cardiovascular: Denies chest pain. Respiratory: Denies shortness of  breath. Gastrointestinal: No abdominal pain.  No nausea, no vomiting.  Musculoskeletal: Positive for left lower leg pain. Skin: Negative for rash. Neurological: Negative for headaches, focal weakness or numbness. ___________________________________________   PHYSICAL EXAM:  VITAL SIGNS: ED Triage Vitals  Enc Vitals Group     BP 09/05/18 1301 (!) 157/87     Pulse Rate 09/05/18 1301 63     Resp 09/05/18 1301 12     Temp 09/05/18 1301 98.4 F (36.9 C)     Temp Source 09/05/18 1301 Oral     SpO2 09/05/18 1301 98 %     Weight 09/05/18 1305 170 lb (77.1 kg)     Height 09/05/18 1305 5\' 11"  (1.803 m)     Head Circumference --      Peak Flow --      Pain Score 09/05/18 1304 10     Pain Loc --      Pain Edu? --      Excl. in GC? --    Constitutional: Alert and oriented. Well appearing and in no acute distress. Eyes: Conjunctivae are normal.  Head: Atraumatic. Nose: No congestion/rhinnorhea. Neck: No stridor.   Cardiovascular: Normal rate, regular rhythm. Grossly normal heart sounds.  Good peripheral circulation. Respiratory: Normal respiratory effort.  No retractions. Lungs CTAB. Gastrointestinal: Soft and nontender. No distention. Musculoskeletal: No gross deformity or tenderness on palpation of the LS spine and range of motion is  without restriction.  No muscle spasms are noted.  There is moderate tenderness on palpation of the left SI joint area and lateral left hip tenderness.  No soft tissue edema or discoloration is noted.  Patient has pain with abduction and abduction.  Good muscle strength bilaterally.  Straight leg raises are negative however he does have discomfort with elevation of his left leg.  Patient is ambulatory without any assistance. Neurologic:  Normal speech and language. No gross focal neurologic deficits are appreciated.  Reflexes are 2+ bilaterally.  No gait instability. Skin:  Skin is warm, dry and intact.  Psychiatric: Mood and affect are normal. Speech and  behavior are normal.  ____________________________________________   LABS (all labs ordered are listed, but only abnormal results are displayed)  Labs Reviewed - No data to display  RADIOLOGY  Official radiology report(s): Dg Hip Unilat W Or Wo Pelvis 2-3 Views Left  Result Date: 09/05/2018 CLINICAL DATA:  Patient states he has had left hip pain for the past 7 months. Patient denies known fall or injury, states pain has worsened over the past month. Patient states he has also been experiencing numbness and tingling that radiates down left leg. EXAM: DG HIP (WITH OR WITHOUT PELVIS) 2-3V LEFT COMPARISON:  None. FINDINGS: There is no acute fracture or subluxation. Visualized bowel gas pattern is nonobstructive. Soft tissue calcification projects in the region of the RIGHT buttock, measuring 2.7 centimeters, possibly representing fat necrosis. IMPRESSION: No evidence for acute  abnormality. Electronically Signed   By: Norva PavlovElizabeth  Brown M.D.   On: 09/05/2018 14:14    ____________________________________________   PROCEDURES  Procedure(s) performed: None  Procedures  Critical Care performed: No  ____________________________________________   INITIAL IMPRESSION / ASSESSMENT AND PLAN / ED COURSE  As part of my medical decision making, I reviewed the following data within the electronic MEDICAL RECORD NUMBER Notes from prior ED visits and [place] Controlled Substance Database  Patient presents to the ED with complaint of left lower extremity discomfort that has occurred over the last 7 months.  He has been doing exercises based on sciatica without any relief.  He has infrequently taken ibuprofen.  No history of injury is reported.  On physical exam there is no gross deformity and exam is consistent with left leg sciatica.  On x-ray there was an area of soft tissue calcification in the right buttocks measuring 2.7 cm most likely representing fat necrosis.  Otherwise x-rays were negative per  radiologist.  Patient was given an injection of Decadron 10 mg IM.  He is to continue with Decadron taper.  He is encouraged to follow-up with his primary care provider or Dr. Hyacinth MeekerMiller who is on-call for orthopedics.  ____________________________________________   FINAL CLINICAL IMPRESSION(S) / ED DIAGNOSES  Final diagnoses:  Acute pain of left hip  Acute leg pain, left  Radiculopathy of leg     ED Discharge Orders         Ordered    predniSONE (DELTASONE) 10 MG tablet     09/05/18 1445           Note:  This document was prepared using Dragon voice recognition software and may include unintentional dictation errors.    Tommi RumpsSummers, Lorretta Kerce L, PA-C 09/05/18 1708    Phineas SemenGoodman, Graydon, MD 09/06/18 [phone]

## 2018-09-05 NOTE — Discharge Instructions (Addendum)
Follow-up with Dr. Hyacinth MeekerMiller who is the orthopedist on call if any continued problems.  His contact information is listed on your discharge papers.  Begin taking prednisone as directed.  You may use ice or heat to your left hip and lower back if needed for discomfort.

## 2018-09-05 NOTE — ED Triage Notes (Signed)
Says last month left posterior leg started hurting from hip to foot.  No injury. Had this in April.  Says he has looked up the sciatica and tried all the remedies without relief.

## 2018-09-05 NOTE — ED Notes (Signed)
Pt leaving for imaging.

## 2018-09-05 NOTE — ED Notes (Signed)
Pt complains of sharp pain from L hip to knee and calf. States its been hurting on-off for a couple months. Pt states he was told it is due to a sciatic nerve. Pt states he is not sleeping well because of it. Dorsalis pedis pulse 2+ L side. Leg warm and appropriate color.

## 2018-10-12 ENCOUNTER — Encounter: Payer: Self-pay | Admitting: Emergency Medicine

## 2018-10-12 ENCOUNTER — Other Ambulatory Visit: Payer: Self-pay

## 2018-10-12 ENCOUNTER — Emergency Department
Admission: EM | Admit: 2018-10-12 | Discharge: 2018-10-12 | Discharge status: Against Medical Advice | Payer: Self-pay | Attending: Emergency Medicine | Admitting: Emergency Medicine

## 2018-10-12 DIAGNOSIS — M5432 Sciatica, left side: Secondary | ICD-10-CM | POA: Insufficient documentation

## 2018-10-12 NOTE — ED Provider Notes (Signed)
[facility] Emergency Department Provider Note  ____________________________________________   First MD Initiated Contact with Patient 10/12/18 [phone]     (approximate)  I have reviewed the triage vital signs and the nursing notes.   HISTORY  Chief Complaint Leg Pain   HPI Anthony Levine is a 43 y.o. male presents to the ED with complaint of sciatic pain.  Patient states the same pain that he was seen for before.  There is been no new injury or changes since he was last seen.  Patient was seen on 09/05/2018 for the same at which time he was placed on steroid taper which he states he finished.  He did not make an appointment with Dr. Hyacinth Meeker as instructed.  This is an ongoing problem for this patient.  He rates his pain as a 10/10.    Past Medical History:  Diagnosis Date  . Blood clot in abdominal vein     There are no active problems to display for this patient.   Past Surgical History:  Procedure Laterality Date  . blood clot removal from abdomen      Prior to Admission medications   Medication Sig Start Date End Date Taking? Authorizing Provider  imiquimod Mathis Dad) 5 % cream Apply to affected area three times weekly 08/04/18 08/04/19  Enid Derry, PA-C  predniSONE (DELTASONE) 10 MG tablet Take 6 tablets  today, on day 2 take 5 tablets, day 3 take 4 tablets, day 4 take 3 tablets, day 5 take  2 tablets and 1 tablet the last day 09/05/18   Tommi Rumps, PA-C    Allergies Tylenol [acetaminophen]  No family history on file.  Social History Social History   Tobacco Use  . Smoking status: Current Some Day Smoker    Packs/day: 0.10    Types: Cigarettes  . Smokeless tobacco: Never Used  Substance Use Topics  . Alcohol use: No  . Drug use: Yes    Types: Marijuana    Review of Systems Constitutional: No fever/chills Cardiovascular: Denies chest pain. Respiratory: Denies shortness of breath. Gastrointestinal: No abdominal  pain.  No nausea, no vomiting.  Genitourinary: Negative for dysuria. Musculoskeletal: Positive for left-sided back pain with left sciatica. Skin: Negative for rash. Neurological: Negative for focal weakness or numbness. ___________________________________________   PHYSICAL EXAM:  VITAL SIGNS: ED Triage Vitals  Enc Vitals Group     BP 10/12/18 0721 (!) 148/96     Pulse Rate 10/12/18 0721 63     Resp 10/12/18 0721 18     Temp 10/12/18 0721 98.2 F (36.8 C)     Temp Source 10/12/18 0721 Oral     SpO2 10/12/18 0721 99 %     Weight 10/12/18 0722 175 lb (79.4 kg)     Height 10/12/18 0722 5\' 11"  (1.803 m)     Head Circumference --      Peak Flow --      Pain Score 10/12/18 0722 10     Pain Loc --      Pain Edu? --      Excl. in GC? --    Constitutional: Alert and oriented. Well appearing and in no acute distress. Eyes: Conjunctivae are normal.  Head: Atraumatic. Neck: No stridor.   Cardiovascular: Normal rate, regular rhythm. Grossly normal heart sounds.  Good peripheral circulation. Respiratory: Normal respiratory effort.  No retractions. Lungs CTAB. Gastrointestinal: Soft and nontender. No distention.  Musculoskeletal: On examination of the back there is no gross deformity and  no difficulty with range of motion.  No muscle spasms were seen.  There is point tenderness on palpation of the left SI joint.  Straight leg raises are negative.  Muscle strength bilaterally. Neurologic:  Normal speech and language. No gross focal neurologic deficits are appreciated.  Reflexes 2+ bilaterally.  No gait instability. Skin:  Skin is warm, dry and intact. No rash noted. Psychiatric: Mood and affect are normal. Speech and behavior are normal.  ____________________________________________   LABS (all labs ordered are listed, but only abnormal results are displayed)  Labs Reviewed - No data to display    PROCEDURES  Procedure(s) performed: None  Procedures  Critical Care performed:  No  ____________________________________________   INITIAL IMPRESSION / ASSESSMENT AND PLAN / ED COURSE  As part of my medical decision making, I reviewed the following data within the electronic MEDICAL RECORD NUMBER Notes from prior ED visits and [place] Controlled Substance Database  ----------------------------------------- 8:51 AM on 10/12/2018 ----------------------------------------- Patient left the room immediately after being examined and told that he was just on steroids 4 weeks ago.  He became angry jumped up ripped his gown off and threw it on the floor.  He got dressed and stormed out of the room disgruntled and mumbling that this was a waste of time.  No prescriptions were sent to pharmacy as patient did not wait to be discharged.  Patient presents to the ED with complaint of left sided back pain with left leg radiculopathy.  He has history of same and was seen in the ED 4 weeks ago at which time he was treated with a steroid taper.  He was also referred to orthopedics but did not call to make an appointment.  He presents today with same symptoms without history of injury.  Patient neurologically is intact.  See note above. ____________________________________________   FINAL CLINICAL IMPRESSION(S) / ED DIAGNOSES  Final diagnoses:  Sciatica of left side     ED Discharge Orders    None       Note:  This document was prepared using Dragon voice recognition software and may include unintentional dictation errors.    Tommi Rumps, PA-C 10/12/18 1153    Jeanmarie Plant, MD 10/12/18 [phone]

## 2018-10-12 NOTE — ED Triage Notes (Signed)
L leg pain running down back of leg. History of same. No new fall or injury.

## 2018-10-12 NOTE — ED Notes (Signed)
Pt to ed with c/o left leg pain that started at hip and radiates down the leg.  Pt reports similar episode in November for which he was seen and received prednisone and felt better.  Pt states this pain started last week.  Pt ambulates with mild limp.

## 2018-10-14 ENCOUNTER — Other Ambulatory Visit: Payer: Self-pay

## 2018-10-14 ENCOUNTER — Emergency Department
Admission: EM | Admit: 2018-10-14 | Discharge: 2018-10-14 | Disposition: A | Discharge status: Home / Self Care | Payer: Self-pay | Attending: Emergency Medicine | Admitting: Emergency Medicine

## 2018-10-14 DIAGNOSIS — M5432 Sciatica, left side: Secondary | ICD-10-CM | POA: Insufficient documentation

## 2018-10-14 DIAGNOSIS — F17210 Nicotine dependence, cigarettes, uncomplicated: Secondary | ICD-10-CM | POA: Insufficient documentation

## 2018-10-14 DIAGNOSIS — Z79899 Other long term (current) drug therapy: Secondary | ICD-10-CM | POA: Insufficient documentation

## 2018-10-14 MED ORDER — METHYLPREDNISOLONE SODIUM SUCC 125 MG IJ SOLR
125.0000 mg | Freq: Once | INTRAMUSCULAR | Status: AC
  Administered 2018-10-14: 125 mg via INTRAMUSCULAR
  Filled 2018-10-14: qty 2

## 2018-10-14 NOTE — ED Triage Notes (Signed)
Pt c/o left lower back pain that radiates into the left leg for the past week. States he was here for the same on Friday. States the last time a cortisone shot helped him.

## 2018-10-14 NOTE — ED Notes (Signed)
See triage note states he is having pain to left leg  States he has been seen and treated for same  Denies any recent injury

## 2018-10-14 NOTE — ED Provider Notes (Signed)
[facility] Emergency Department Provider Note   ____________________________________________   First MD Initiated Contact with Patient 10/14/18 0815     (approximate)  I have reviewed the triage vital signs and the nursing notes.   HISTORY  Chief Complaint Back Pain     HPI Anthony Levine is a 43 y.o. male patient presents with radicular left leg pain for greater than a week.  Patient was seen this facility 3 days ago for same complaint.  Patient was advised to follow-up with orthopedics for definitive evaluation and treatment.  Patient is not contacted orthopedics.  Patient rates his pain as a 10/10.  Patient states intimating aching and numbness.  Patient is taken over-the-counter medication with no relief.   Past Medical History:  Diagnosis Date  . Blood clot in abdominal vein     There are no active problems to display for this patient.   Past Surgical History:  Procedure Laterality Date  . blood clot removal from abdomen      Prior to Admission medications   Medication Sig Start Date End Date Taking? Authorizing Provider  imiquimod Mathis Dad) 5 % cream Apply to affected area three times weekly 08/04/18 08/04/19  Enid Derry, PA-C  predniSONE (DELTASONE) 10 MG tablet Take 6 tablets  today, on day 2 take 5 tablets, day 3 take 4 tablets, day 4 take 3 tablets, day 5 take  2 tablets and 1 tablet the last day 09/05/18   Tommi Rumps, PA-C    Allergies Tylenol [acetaminophen]  No family history on file.  Social History Social History   Tobacco Use  . Smoking status: Current Some Day Smoker    Packs/day: 0.10    Types: Cigarettes  . Smokeless tobacco: Never Used  Substance Use Topics  . Alcohol use: No  . Drug use: Yes    Types: Marijuana    Review of Systems  Constitutional: No fever/chills Eyes: No visual changes. ENT: No sore throat. Cardiovascular: Denies chest pain. Respiratory: Denies shortness of  breath. Gastrointestinal: No abdominal pain.  No nausea, no vomiting.  No diarrhea.  No constipation. Genitourinary: Negative for dysuria. Musculoskeletal: Left leg and back pain. Skin: Negative for rash. Neurological: Negative for headaches, focal weakness or numbness.   ____________________________________________   PHYSICAL EXAM:  VITAL SIGNS: ED Triage Vitals  Enc Vitals Group     BP 10/14/18 0727 (!) 180/99     Pulse Rate 10/14/18 0727 63     Resp 10/14/18 0727 18     Temp 10/14/18 0727 97.6 F (36.4 C)     Temp Source 10/14/18 0727 Oral     SpO2 10/14/18 0727 99 %     Weight 10/14/18 0728 175 lb (79.4 kg)     Height 10/14/18 0728 5\' 11"  (1.803 m)     Head Circumference --      Peak Flow --      Pain Score 10/14/18 0727 10     Pain Loc --      Pain Edu? --      Excl. in GC? --    Constitutional: Alert and oriented. Well appearing and in no acute distress. Cardiovascular: Bradycardic, regular rhythm. Grossly normal heart sounds.  Good peripheral circulation.  Elevated blood pressure. Respiratory: Normal respiratory effort.  No retractions. Lungs CTAB. Musculoskeletal: No leg length discrepancy.  Patient has moderate guarding palpation of the greater trochanter. Neurologic:  Normal speech and language. No gross focal neurologic deficits are appreciated. No gait instability. Skin:  Skin is warm, dry and intact. No rash noted. Psychiatric: Mood and affect are normal. Speech and behavior are normal.  ____________________________________________   LABS (all labs ordered are listed, but only abnormal results are displayed)  Labs Reviewed - No data to display ____________________________________________  EKG   ____________________________________________  RADIOLOGY  ED MD interpretation:    Official radiology report(s): No results found.  ____________________________________________   PROCEDURES  Procedure(s) performed: None  Procedures  Critical Care  performed: No  ____________________________________________   INITIAL IMPRESSION / ASSESSMENT AND PLAN / ED COURSE  As part of my medical decision making, I reviewed the following data within the electronic MEDICAL RECORD NUMBER    Patient presents with left hip and leg pain.  Patient also has history of intermittent back pain.  Discussed x-ray findings with patient.  Advised patient follow-up with orthopedic for definitive evaluation and treatment.      ____________________________________________   FINAL CLINICAL IMPRESSION(S) / ED DIAGNOSES  Final diagnoses:  Sciatica of left side     ED Discharge Orders    None       Note:  This document was prepared using Dragon voice recognition software and may include unintentional dictation errors.    Joni ReiningSmith, Joah Patlan K, PA-C 10/14/18 [id number]    Minna AntisPaduchowski, Kevin, MD 10/14/18 [phone]

## 2018-12-08 ENCOUNTER — Emergency Department
Admission: EM | Admit: 2018-12-08 | Discharge: 2018-12-08 | Disposition: A | Discharge status: Home / Self Care | Payer: Self-pay

## 2018-12-08 ENCOUNTER — Other Ambulatory Visit: Payer: Self-pay

## 2019-04-02 ENCOUNTER — Other Ambulatory Visit: Payer: Self-pay

## 2019-04-02 ENCOUNTER — Emergency Department: Payer: Self-pay

## 2019-04-02 ENCOUNTER — Encounter: Payer: Self-pay | Admitting: *Deleted

## 2019-04-02 DIAGNOSIS — Y9355 Activity, bike riding: Secondary | ICD-10-CM | POA: Insufficient documentation

## 2019-04-02 DIAGNOSIS — F17210 Nicotine dependence, cigarettes, uncomplicated: Secondary | ICD-10-CM | POA: Insufficient documentation

## 2019-04-02 DIAGNOSIS — Y998 Other external cause status: Secondary | ICD-10-CM | POA: Insufficient documentation

## 2019-04-02 DIAGNOSIS — S62665A Nondisplaced fracture of distal phalanx of left ring finger, initial encounter for closed fracture: Secondary | ICD-10-CM | POA: Insufficient documentation

## 2019-04-02 DIAGNOSIS — S63255A Unspecified dislocation of left ring finger, initial encounter: Secondary | ICD-10-CM | POA: Insufficient documentation

## 2019-04-02 DIAGNOSIS — Y92410 Unspecified street and highway as the place of occurrence of the external cause: Secondary | ICD-10-CM | POA: Insufficient documentation
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
## 2019-04-03 ENCOUNTER — Emergency Department
Admission: EM | Admit: 2019-04-03 | Discharge: 2019-04-03 | Disposition: A | Discharge status: Home / Self Care | Payer: Self-pay | Attending: Emergency Medicine | Admitting: Emergency Medicine

## 2019-04-03 ENCOUNTER — Emergency Department: Payer: Self-pay

## 2019-04-03 DIAGNOSIS — S62665A Nondisplaced fracture of distal phalanx of left ring finger, initial encounter for closed fracture: Secondary | ICD-10-CM

## 2019-04-03 DIAGNOSIS — S63255A Unspecified dislocation of left ring finger, initial encounter: Secondary | ICD-10-CM

## 2019-04-03 MED ORDER — LIDOCAINE HCL (PF) 1 % IJ SOLN
5.0000 mL | Freq: Once | INTRAMUSCULAR | Status: AC
  Administered 2019-04-03: 5 mL via INTRADERMAL
  Filled 2019-04-03: qty 5

## 2019-04-03 MED ORDER — OXYCODONE HCL 5 MG PO TABS
5.0000 mg | ORAL_TABLET | Freq: Once | ORAL | Status: AC
  Administered 2019-04-03: 5 mg via ORAL
  Filled 2019-04-03: qty 1

## 2019-04-03 MED ORDER — OXYCODONE HCL 5 MG PO TABS: 5.0000 mg | ORAL_TABLET | Freq: Four times a day (QID) | ORAL | 0 refills | Status: DC | PRN

## 2019-04-03 NOTE — ED Notes (Signed)
Splint applied to finger.
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
## 2019-06-08 ENCOUNTER — Other Ambulatory Visit: Payer: Self-pay

## 2019-06-08 ENCOUNTER — Emergency Department: Payer: Self-pay

## 2019-06-08 ENCOUNTER — Encounter: Payer: Self-pay | Admitting: Emergency Medicine

## 2019-06-08 ENCOUNTER — Emergency Department
Admission: EM | Admit: 2019-06-08 | Discharge: 2019-06-08 | Disposition: A | Discharge status: Home / Self Care | Payer: Self-pay | Attending: Emergency Medicine | Admitting: Emergency Medicine

## 2019-06-08 DIAGNOSIS — Y9355 Activity, bike riding: Secondary | ICD-10-CM | POA: Insufficient documentation

## 2019-06-08 DIAGNOSIS — Y999 Unspecified external cause status: Secondary | ICD-10-CM | POA: Insufficient documentation

## 2019-06-08 DIAGNOSIS — Y929 Unspecified place or not applicable: Secondary | ICD-10-CM | POA: Insufficient documentation

## 2019-06-08 DIAGNOSIS — F17210 Nicotine dependence, cigarettes, uncomplicated: Secondary | ICD-10-CM | POA: Insufficient documentation

## 2019-06-08 DIAGNOSIS — S4992XA Unspecified injury of left shoulder and upper arm, initial encounter: Secondary | ICD-10-CM | POA: Insufficient documentation

## 2019-06-08 MED ORDER — MELOXICAM 15 MG PO TABS: 15.0000 mg | ORAL_TABLET | Freq: Every day | ORAL | 0 refills | Status: DC
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
## 2019-06-08 NOTE — ED Notes (Signed)
No peripheral IV placed this visit.   Discharge instructions reviewed with patient. Questions fielded by this RN. Patient verbalizes understanding of instructions. Patient discharged home in stable condition per EDP. No acute distress noted at time of discharge.   
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
## 2019-10-03 HISTORY — PX: ORIF FINGER / THUMB FRACTURE: SUR932

## 2019-12-01 ENCOUNTER — Emergency Department
Admission: EM | Admit: 2019-12-01 | Discharge: 2019-12-01 | Disposition: A | Discharge status: Home / Self Care | Payer: Medicaid Other | Attending: Emergency Medicine | Admitting: Emergency Medicine

## 2019-12-01 ENCOUNTER — Other Ambulatory Visit: Payer: Self-pay

## 2019-12-01 ENCOUNTER — Encounter: Payer: Self-pay | Admitting: Emergency Medicine

## 2019-12-01 DIAGNOSIS — F17210 Nicotine dependence, cigarettes, uncomplicated: Secondary | ICD-10-CM | POA: Insufficient documentation

## 2019-12-01 DIAGNOSIS — H6506 Acute serous otitis media, recurrent, bilateral: Secondary | ICD-10-CM | POA: Diagnosis not present

## 2019-12-01 DIAGNOSIS — H60593 Other noninfective acute otitis externa, bilateral: Secondary | ICD-10-CM | POA: Insufficient documentation

## 2019-12-01 DIAGNOSIS — L299 Pruritus, unspecified: Secondary | ICD-10-CM | POA: Diagnosis present

## 2019-12-01 DIAGNOSIS — H60543 Acute eczematoid otitis externa, bilateral: Secondary | ICD-10-CM

## 2019-12-01 MED ORDER — HYDROCORTISONE-ACETIC ACID 1-2 % OT SOLN: 4.0000 [drp] | Freq: Two times a day (BID) | OTIC | 0 refills | Status: DC

## 2019-12-01 MED ORDER — CETIRIZINE HCL 10 MG PO CAPS: 10.0000 mg | ORAL_CAPSULE | Freq: Every day | ORAL | 0 refills | Status: DC

## 2019-12-01 NOTE — ED Provider Notes (Signed)
[facility] Emergency Department Provider Note ____________________________________________  Time seen: Approximately 1:11 PM  I have reviewed the triage vital signs and the nursing notes.   HISTORY  Chief Complaint Otalgia    HPI Anthony Levine is a 44 y.o. male presents to the emergency department for treatment and evaluation of itching to bilateral ears and a "sloshing" sound when turning head. He has used Vaseline, neosporin, and peroxide to the ears without relief of itching. Symptoms have been ongoing for about 1 week.  Past Medical History:  Diagnosis Date  . Blood clot in abdominal vein     There are no problems to display for this patient.   Past Surgical History:  Procedure Laterality Date  . blood clot removal from abdomen      Prior to Admission medications   Medication Sig Start Date End Date Taking? Authorizing Provider  acetic acid-hydrocortisone (VOSOL-HC) OTIC solution Place 4 drops into both ears 2 (two) times daily. 12/01/19   Zakiya Sporrer, Rulon Eisenmenger B, FNP  Cetirizine HCl 10 MG CAPS Take 1 capsule (10 mg total) by mouth daily. 12/01/19   Kem Boroughs B, FNP    Allergies Tylenol [acetaminophen]  No family history on file.  Social History Social History   Tobacco Use  . Smoking status: Current Some Day Smoker    Packs/day: 0.10    Types: Cigarettes  . Smokeless tobacco: Never Used  Substance Use Topics  . Alcohol use: Yes  . Drug use: Yes    Types: Marijuana    Review of Systems Constitutional: Negataive for fever. Positive for decreased ability to hear from bilateral ear(s). Eyes: Negative for discharge or drainage. ENT:       Negative for otalgia in either ear(s).      Negative for rhinorrhea or congestion.      Negative for sore throat. Gastrointestinal: Negative for nausea, vomiting, or diarrhea. Musculoskeletal: Negative for myalgias. Skin: Negative for rash, lesions, or wounds. Neurological: Negative for  paresthesias. ____________________________________________   PHYSICAL EXAM:  VITAL SIGNS: ED Triage Vitals  Enc Vitals Group     BP 12/01/19 1136 (!) 148/88     Pulse Rate 12/01/19 1136 77     Resp 12/01/19 1136 18     Temp 12/01/19 1136 98.2 F (36.8 C)     Temp Source 12/01/19 1136 Oral     SpO2 12/01/19 1136 98 %     Weight 12/01/19 1127 175 lb (79.4 kg)     Height 12/01/19 1127 5\' 11"  (1.803 m)     Head Circumference --      Peak Flow --      Pain Score 12/01/19 1127 6     Pain Loc --      Pain Edu? --      Excl. in GC? --     Constitutional: Well appearing. Eyes: Conjunctivae are clear without discharge or drainage. Ears:       Right TM: air fluid level present, skin flaking and excoriation to EAC.      Left TM: air fluid level present, skin flaking and excoriation to EAC. Head: Atraumatic. Nose: No rhinorrhea or sinus pain on percussion. Mouth/Throat: Oropharynx normal. Tonsils normal without exudate. Hematological/Lymphatic/Immunilogical: No palpable anterior cervical lymphadenopathy. Cardiovascular: Heart rate and rhythm are regular without murmur, gallop, or rub appreciated. Respiratory: Breath sounds are clear throughout to auscultation.  Neurologic:  Alert and oriented x 4. Skin: Intact and without rash, lesion, or wound on exposed skin surfaces. ____________________________________________  LABS (all labs ordered are listed, but only abnormal results are displayed)  Labs Reviewed - No data to display ____________________________________________   RADIOLOGY  Not indicated ____________________________________________   PROCEDURES  Procedure(s) performed:   Procedures  ____________________________________________   INITIAL IMPRESSION / ASSESSMENT AND PLAN / ED COURSE  44 year old male presents to the emergency department for treatment and evaluation of issues with bilateral ears. See HPI for further details.   Exam reveals serous om and  likely atopic dermatitis in EAC. He will be treated with zyrtec and steroid drops. He is to follow up with primary care for symptoms that are not improving. He is to return to the ER for symptoms that change or worsen or for new concerns.  Pertinent labs & imaging results that were available during my care of the patient were reviewed by me and considered in my medical decision making (see chart for details). ____________________________________________   FINAL CLINICAL IMPRESSION(S) / ED DIAGNOSES  Final diagnoses:  Dermatitis of ear canal, bilateral  Recurrent acute serous otitis media of both ears    ED Discharge Orders         Ordered    Cetirizine HCl 10 MG CAPS  Daily,   Status:  Discontinued     12/01/19 1215    acetic acid-hydrocortisone (VOSOL-HC) OTIC solution  2 times daily     12/01/19 1215    Cetirizine HCl 10 MG CAPS  Daily     12/01/19 1216          If controlled substance prescribed during this visit, 12 month history viewed on the [place] prior to issuing an initial prescription for Schedule II or III opiod.   Note:  This document was prepared using Dragon voice recognition software and may include unintentional dictation errors.    Victorino Dike, FNP 12/01/19 1353    Blake Divine, MD 12/01/19 [phone]

## 2019-12-01 NOTE — ED Triage Notes (Signed)
Pt in via POV, reports bilateral ear pain x approximately one week.  Denies any other complaints.  Ambulatory to triage, NAD noted at this time.

## 2019-12-01 NOTE — ED Notes (Signed)
See triage note presents with "itching" to both ears for couple of weeks   States he feels like his ears are slightly swollen

## 2019-12-13 ENCOUNTER — Emergency Department
Admission: EM | Admit: 2019-12-13 | Discharge: 2019-12-14 | Disposition: A | Discharge status: Other Acute Inpatient Hospital | Payer: Medicaid Other | Attending: Emergency Medicine | Admitting: Emergency Medicine

## 2019-12-13 ENCOUNTER — Emergency Department: Payer: Medicaid Other

## 2019-12-13 ENCOUNTER — Other Ambulatory Visit: Payer: Self-pay

## 2019-12-13 DIAGNOSIS — Y9289 Other specified places as the place of occurrence of the external cause: Secondary | ICD-10-CM | POA: Diagnosis not present

## 2019-12-13 DIAGNOSIS — Y999 Unspecified external cause status: Secondary | ICD-10-CM | POA: Insufficient documentation

## 2019-12-13 DIAGNOSIS — S3991XA Unspecified injury of abdomen, initial encounter: Secondary | ICD-10-CM | POA: Insufficient documentation

## 2019-12-13 DIAGNOSIS — S37002A Unspecified injury of left kidney, initial encounter: Secondary | ICD-10-CM | POA: Insufficient documentation

## 2019-12-13 DIAGNOSIS — S6992XA Unspecified injury of left wrist, hand and finger(s), initial encounter: Secondary | ICD-10-CM | POA: Diagnosis present

## 2019-12-13 DIAGNOSIS — S299XXA Unspecified injury of thorax, initial encounter: Secondary | ICD-10-CM | POA: Insufficient documentation

## 2019-12-13 DIAGNOSIS — Y9355 Activity, bike riding: Secondary | ICD-10-CM | POA: Diagnosis not present

## 2019-12-13 DIAGNOSIS — Z20822 Contact with and (suspected) exposure to covid-19: Secondary | ICD-10-CM | POA: Diagnosis not present

## 2019-12-13 DIAGNOSIS — R0789 Other chest pain: Secondary | ICD-10-CM | POA: Insufficient documentation

## 2019-12-13 DIAGNOSIS — R319 Hematuria, unspecified: Secondary | ICD-10-CM | POA: Insufficient documentation

## 2019-12-13 DIAGNOSIS — S62311A Displaced fracture of base of second metacarpal bone. left hand, initial encounter for closed fracture: Secondary | ICD-10-CM | POA: Insufficient documentation

## 2019-12-13 DIAGNOSIS — F17210 Nicotine dependence, cigarettes, uncomplicated: Secondary | ICD-10-CM | POA: Diagnosis not present

## 2019-12-13 DIAGNOSIS — R1084 Generalized abdominal pain: Secondary | ICD-10-CM | POA: Diagnosis not present

## 2019-12-13 DIAGNOSIS — S62310A Displaced fracture of base of second metacarpal bone, right hand, initial encounter for closed fracture: Secondary | ICD-10-CM

## 2019-12-13 LAB — URINALYSIS, COMPLETE (UACMP) WITH MICROSCOPIC
Bacteria, UA: NONE SEEN
Bilirubin Urine: NEGATIVE
Glucose, UA: NEGATIVE mg/dL
Ketones, ur: 20 mg/dL — AB
Leukocytes,Ua: NEGATIVE
Nitrite: NEGATIVE
Protein, ur: 100 mg/dL — AB
RBC / HPF: >50 RBC/hpf — ABNORMAL HIGH (ref 0–5)
Specific Gravity, Urine: 1.02 (ref 1.005–1.030)
Squamous Epithelial / HPF: NONE SEEN (ref 0–5)
pH: 5 (ref 5.0–8.0)

## 2019-12-13 LAB — CBC WITH DIFFERENTIAL/PLATELET
Abs Immature Granulocytes: 0.05 10*3/uL (ref 0.00–0.07)
Basophils Absolute: 0 10*3/uL (ref 0.0–0.1)
Basophils Relative: 0 %
Eosinophils Absolute: 0 10*3/uL (ref 0.0–0.5)
Eosinophils Relative: 0 %
HCT: 40 % (ref 39.0–52.0)
Hemoglobin: 13.6 g/dL (ref 13.0–17.0)
Immature Granulocytes: 0 %
Lymphocytes Relative: 15 %
Lymphs Abs: 2 10*3/uL (ref 0.7–4.0)
MCH: 29.3 pg (ref 26.0–34.0)
MCHC: 34 g/dL (ref 30.0–36.0)
MCV: 86.2 fL (ref 80.0–100.0)
Monocytes Absolute: 0.8 10*3/uL (ref 0.1–1.0)
Monocytes Relative: 6 %
Neutro Abs: 10.6 10*3/uL — ABNORMAL HIGH (ref 1.7–7.7)
Neutrophils Relative %: 79 %
Platelets: 262 10*3/uL (ref 150–400)
RBC: 4.64 MIL/uL (ref 4.22–5.81)
RDW: 16.2 % — ABNORMAL HIGH (ref 11.5–15.5)
WBC: 13.5 10*3/uL — ABNORMAL HIGH (ref 4.0–10.5)
nRBC: 0 % (ref 0.0–0.2)

## 2019-12-13 LAB — COMPREHENSIVE METABOLIC PANEL
ALT: 16 U/L (ref 0–44)
AST: 22 U/L (ref 15–41)
Albumin: 4.1 g/dL (ref 3.5–5.0)
Alkaline Phosphatase: 68 U/L (ref 38–126)
Anion gap: 9 (ref 5–15)
BUN: 18 mg/dL (ref 6–20)
CO2: 26 mmol/L (ref 22–32)
Calcium: 9.3 mg/dL (ref 8.9–10.3)
Chloride: 107 mmol/L (ref 98–111)
Creatinine, Ser: 1.01 mg/dL (ref 0.61–1.24)
GFR calc Af Amer: >60 mL/min (ref >60)
GFR calc non Af Amer: >60 mL/min (ref >60)
Glucose, Bld: 97 mg/dL (ref 70–99)
Potassium: 3.9 mmol/L (ref 3.5–5.1)
Sodium: 142 mmol/L (ref 135–145)
Total Bilirubin: 0.6 mg/dL (ref 0.3–1.2)
Total Protein: 7.1 g/dL (ref 6.5–8.1)

## 2019-12-13 MED ORDER — ONDANSETRON HCL 4 MG/2ML IJ SOLN
4.0000 mg | Freq: Once | INTRAMUSCULAR | Status: AC
  Administered 2019-12-13: 4 mg via INTRAVENOUS
  Filled 2019-12-13: qty 2

## 2019-12-13 MED ORDER — HYDROMORPHONE HCL 1 MG/ML IJ SOLN
1.0000 mg | Freq: Once | INTRAMUSCULAR | Status: AC
  Administered 2019-12-13: 1 mg via INTRAVENOUS
  Filled 2019-12-13: qty 1

## 2019-12-13 MED ORDER — IOHEXOL 300 MG/ML  SOLN
100.0000 mL | Freq: Once | INTRAMUSCULAR | Status: DC | PRN
  Filled 2019-12-13: qty 100

## 2019-12-13 MED ORDER — MORPHINE SULFATE (PF) 4 MG/ML IV SOLN
4.0000 mg | Freq: Once | INTRAVENOUS | Status: AC
  Administered 2019-12-13: 4 mg via INTRAVENOUS
  Filled 2019-12-13: qty 1

## 2019-12-13 NOTE — ED Notes (Signed)
Pt leaving for CT.  

## 2019-12-13 NOTE — ED Notes (Signed)
Report given to April, RN

## 2019-12-13 NOTE — ED Notes (Signed)
Pt aware urine sample needed. States he will provide one soon. Call bell within reach. Bed locked low. Rail up.

## 2019-12-13 NOTE — ED Provider Notes (Signed)
[facility] Emergency Department Provider Note  ____________________________________________   First MD Initiated Contact with Patient 12/13/19 2041     (approximate)  I have reviewed the triage vital signs and the nursing notes.   HISTORY  Chief Complaint Hand Injury and Rib Injury    HPI Anthony Levine is a 44 y.o. male presents emergency department [place] [employer] [place]  Patient fell onto his side with his arm digging into his side and injured his hand.  He is complaining of rib pain, left hand pain, abdominal pain/bloating and blood in his urine.    Past Medical History:  Diagnosis Date  . Blood clot in abdominal vein     There are no problems to display for this patient.   Past Surgical History:  Procedure Laterality Date  . blood clot removal from abdomen      Prior to Admission medications   Medication Sig Start Date End Date Taking? Authorizing Provider  acetic acid-hydrocortisone (VOSOL-HC) OTIC solution Place 4 drops into both ears 2 (two) times daily. 12/01/19   Triplett, Rulon Eisenmenger B, FNP  Cetirizine HCl 10 MG CAPS Take 1 capsule (10 mg total) by mouth daily. 12/01/19   Chinita Pester, FNP    Allergies Tylenol [acetaminophen]  History reviewed. No pertinent family history.  Social History Social History   Tobacco Use  . Smoking status: Current Some Day Smoker    Packs/day: 0.10    Types: Cigarettes  . Smokeless tobacco: Never Used  Substance Use Topics  . Alcohol use: Yes  . Drug use: Yes    Types: Marijuana    Review of Systems  Constitutional: No fever/chills Eyes: No visual changes. ENT: No sore throat. Respiratory: Denies cough Cardiovascular: Denies chest pain Gastrointestinal: Positive abdominal pain Genitourinary: Negative for dysuria.  Positive episode for hematuria Musculoskeletal: Negative for back pain.  Positive for left rib pain and left hand pain Skin: Negative for rash.  Psychiatric: no mood changes,     ____________________________________________   PHYSICAL EXAM:  VITAL SIGNS: ED Triage Vitals  Enc Vitals Group     BP 12/13/19 1855 (!) 131/92     Pulse Rate 12/13/19 1855 89     Resp 12/13/19 1855 18     Temp 12/13/19 1855 98.9 F (37.2 C)     Temp Source 12/13/19 1855 Oral     SpO2 12/13/19 1855 97 %     Weight 12/13/19 1853 175 lb (79.4 kg)     Height 12/13/19 1853 5\' 11"  (1.803 m)     Head Circumference --      Peak Flow --      Pain Score --      Pain Loc --      Pain Edu? --      Excl. in GC? --     Constitutional: Alert and oriented. Well appearing and in no acute distress. Eyes: Conjunctivae are normal.  Head: Atraumatic. Nose: No congestion/rhinnorhea. Mouth/Throat: Mucous membranes are moist.   Neck:  supple no lymphadenopathy noted Cardiovascular: Normal rate, regular rhythm. Heart sounds are normal Respiratory: Normal respiratory effort.  No retractions, lungs c t a  Abd: soft mildly tender bs normal all 4 quad, no bruising noted GU: deferred Musculoskeletal: FROM all extremities, warm and well perfused, left hand has large amount of swelling noted across the metacarpals, ribs are minimally tender, spine is not tender Neurologic:  Normal speech and language.  Skin:  Skin is warm, dry  and intact. No rash noted. Psychiatric: Mood and affect are normal. Speech and behavior are normal.  ____________________________________________   LABS (all labs ordered are listed, but only abnormal results are displayed)  Labs Reviewed  CBC WITH DIFFERENTIAL/PLATELET - Abnormal; Notable for the following components:      Result Value   WBC 13.5 (*)    RDW 16.2 (*)    Neutro Abs 10.6 (*)    All other components within normal limits  URINALYSIS, COMPLETE (UACMP) WITH MICROSCOPIC - Abnormal; Notable for the following components:   Color, Urine AMBER (*)    APPearance CLOUDY (*)    Hgb urine dipstick LARGE (*)    Ketones, ur 20 (*)     Protein, ur 100 (*)    RBC / HPF >50 (*)    All other components within normal limits  COMPREHENSIVE METABOLIC PANEL   ____________________________________________   ____________________________________________  RADIOLOGY  X-ray of the left hand shows a proximal second metacarpal fracture which is displaced X-ray of the left ribs is negative CT abdomen/pelvis without contrast as patient refused does show some parenchymal stranding near the left kidney which could indicate kidney injury  ____________________________________________   PROCEDURES  Procedure(s) performed: Volar OCL applied by nursing staff  Saline lock, morphine 4 mg IV, Zofran 4 mg IV, Dilaudid 1 mg IV  Procedures    ____________________________________________   INITIAL IMPRESSION / ASSESSMENT AND PLAN / ED COURSE  Pertinent labs & imaging results that were available during my care of the patient were reviewed by me and considered in my medical decision making (see chart for details).   Patient is 44 year old male presents emergency department after a fall.  Complaining of left hand pain, left rib pain, and abdominal pain along with hematuria after the fall  Physical exam does show the left hand to be swollen and tender, left ribs tender, abdomen is minimally tender, spine is not tender.  Remainder of exam is unremarkable  DDx: Hand fracture, rib fracture, abdominal trauma, kidney laceration  CBC is elevated WBC of 13.5, comprehensive metabolic panel is normal, urinalysis does show a large amount of hemoglobin, 100 protein and greater than 50 RBCs  X-ray of the left hand shows a second metacarpal fracture, x-ray of the left ribs and chest are negative CT of the abdomen/pelvis was ordered with IV contrast but the patient refused the contrast.  The CT results without contrast do show parenchymal stranding near the left kidney.  Due to the parenchymal stranding along with the blood in the urine with the  left-sided injury I do have concerns of a kidney injury.  I discussed this with Dr. Colon Branch.  She agrees and told me to call surgery to see if they would accept him here otherwise he will be transferred to a trauma center.  I explained all of this to the patient.  I did talk with Dr. Tonna Boehringer from general surgery.  He instructed me to transfer the patient to a trauma center.  I discussed all this with Dr. Larinda Buttery as he will be receiving care for the patient.  He is to call trauma center for transfer.  Patient was transferred to the major side in stable condition.     Anthony Levine was evaluated in Emergency Department on 12/13/2019 for the symptoms described in the history of present illness. He was evaluated in the context of the global COVID-19 pandemic, which necessitated consideration that the patient might be at risk for infection with the SARS-CoV-2 virus  that causes COVID-19. Institutional protocols and algorithms that pertain to the evaluation of patients at risk for COVID-19 are in a state of rapid change based on information released by regulatory bodies including the CDC and federal and state organizations. These policies and algorithms were followed during the patient's care in the ED.   As part of my medical decision making, I reviewed the following data within the [place] notes reviewed and incorporated, Labs reviewed , Old chart reviewed, Radiograph reviewed , A consult was requested and obtained from this/these consultant(s) Surgery, Evaluated by EM attending Dr. Charna Archer, Notes from prior ED visits and [place] Controlled Substance Database  ____________________________________________   FINAL CLINICAL IMPRESSION(S) / ED DIAGNOSES  Final diagnoses:  Closed displaced fracture of base of second metacarpal bone of right hand, initial encounter  Traumatic injury of the kidney, left, initial encounter      NEW MEDICATIONS STARTED DURING THIS VISIT:  New  Prescriptions   No medications on file     Note:  This document was prepared using Dragon voice recognition software and may include unintentional dictation errors.    Versie Starks, PA-C 12/13/19 2344    Blake Divine, MD 12/14/19 [phone]
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
## 2019-12-13 NOTE — ED Notes (Signed)
This RN to bedside. Pt giggling R foot; when asked, pt states 8/10 pain remains in L ribs and L hand. Pt requesting a different pain med. Provider Fisher notified.
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
## 2019-12-13 NOTE — ED Notes (Signed)
2nd grn tube sent to lab as requested by lab. Pt up to bathroom to attempt to provide urine sample.

## 2019-12-14 LAB — RESPIRATORY PANEL BY RT PCR (FLU A&B, COVID)
Influenza A by PCR: NEGATIVE
Influenza B by PCR: NEGATIVE
SARS Coronavirus 2 by RT PCR: NEGATIVE

## 2019-12-14 NOTE — ED Notes (Signed)
Accepted to [facility] ED   ?

## 2019-12-14 NOTE — ED Notes (Signed)
CT powershare to [facility]

## 2019-12-31 ENCOUNTER — Other Ambulatory Visit: Payer: Self-pay

## 2019-12-31 ENCOUNTER — Emergency Department
Admission: EM | Admit: 2019-12-31 | Discharge: 2020-01-01 | Disposition: A | Discharge status: Home / Self Care | Payer: Medicaid Other | Attending: Emergency Medicine | Admitting: Emergency Medicine

## 2019-12-31 DIAGNOSIS — Z4789 Encounter for other orthopedic aftercare: Secondary | ICD-10-CM | POA: Insufficient documentation

## 2019-12-31 DIAGNOSIS — F17210 Nicotine dependence, cigarettes, uncomplicated: Secondary | ICD-10-CM | POA: Diagnosis not present

## 2019-12-31 DIAGNOSIS — F1210 Cannabis abuse, uncomplicated: Secondary | ICD-10-CM | POA: Insufficient documentation

## 2019-12-31 NOTE — ED Triage Notes (Signed)
Pt had surgery on left hand after a 2nd metatarsal fracture. States had surgery on the 18 th and got cast wet yesterday. Here to have cast "rewrapped".

## 2019-12-31 NOTE — Discharge Instructions (Addendum)
Keep splint clean and dry.  Return to the ER for worsening symptoms or other concerns.

## 2019-12-31 NOTE — ED Provider Notes (Signed)
[facility] Emergency Department Provider Note   ____________________________________________   First MD Initiated Contact with Patient 12/31/19 2332     (approximate)  I have reviewed the triage vital signs and the nursing notes.   HISTORY  Chief Complaint Post-op Problem    HPI Nedrow is a 44 y.o. male who presents to the ED from home requesting a new splint.  Patient had fixation of his nondominant, left second and third carpometacarpal fracture dislocations on 3/23 at [facility]  He was placed in a splint which got wet yesterday which has warped the cotton padding.  Otherwise reports no complaints or injuries.      Past Medical History:  Diagnosis Date  . Blood clot in abdominal vein     There are no problems to display for this patient.   Past Surgical History:  Procedure Laterality Date  . blood clot removal from abdomen      Prior to Admission medications   Medication Sig Start Date End Date Taking? Authorizing Provider  acetic acid-hydrocortisone (VOSOL-HC) OTIC solution Place 4 drops into both ears 2 (two) times daily. 12/01/19   Triplett, Johnette Abraham B, FNP  Cetirizine HCl 10 MG CAPS Take 1 capsule (10 mg total) by mouth daily. 12/01/19   Sherrie George B, FNP    Allergies Tylenol [acetaminophen]  No family history on file.  Social History Social History   Tobacco Use  . Smoking status: Current Some Day Smoker    Packs/day: 0.10    Types: Cigarettes  . Smokeless tobacco: Never Used  Substance Use Topics  . Alcohol use: Yes  . Drug use: Yes    Types: Marijuana    Review of Systems  Constitutional: No fever/chills Eyes: No visual changes. ENT: No sore throat. Cardiovascular: Denies chest pain. Respiratory: Denies shortness of breath. Gastrointestinal: No abdominal pain.  No nausea, no vomiting.  No diarrhea.  No constipation. Genitourinary: Negative for dysuria. Musculoskeletal: Positive for splint getting wet.   Negative for back pain. Skin: Negative for rash. Neurological: Negative for headaches, focal weakness or numbness.   ____________________________________________   PHYSICAL EXAM:  VITAL SIGNS: ED Triage Vitals  Enc Vitals Group     BP 12/31/19 2230 (!) 147/97     Pulse Rate 12/31/19 2228 84     Resp 12/31/19 2228 20     Temp 12/31/19 2230 98.4 F (36.9 C)     Temp Source 12/31/19 2230 Oral     SpO2 12/31/19 2228 100 %     Weight 12/31/19 2228 172 lb (78 kg)     Height 12/31/19 2228 5\' 11"  (1.803 m)     Head Circumference --      Peak Flow --      Pain Score 12/31/19 2228 0     Pain Loc --      Pain Edu? --      Excl. in [employer]? --     Constitutional: Alert and oriented. Well appearing and in no acute distress. Eyes: Conjunctivae are normal. PERRL. EOMI. Head: Atraumatic. Nose: No congestion/rhinnorhea. Mouth/Throat: Mucous membranes are moist.   Neck: No stridor.   Cardiovascular: Normal rate, regular rhythm. Grossly normal heart sounds.  Good peripheral circulation. Respiratory: Normal respiratory effort.  No retractions. Lungs CTAB. Gastrointestinal: Soft and nontender. No distention. No abdominal bruits. No CVA tenderness. Musculoskeletal:  Left splint noted to be dirty and cotton webbing puffy from getting wet.  Looks like a modified sugar tong splint with OCL on the  dorsal and ventral aspects of the hand, wrist and forearm.  2+ radial pulse.  Brisk, less than 5-second capillary refill. No lower extremity tenderness nor edema.  No joint effusions. Neurologic:  Normal speech and language. No gross focal neurologic deficits are appreciated. No gait instability. Skin:  Skin is warm, dry and intact. No rash noted. Psychiatric: Mood and affect are normal. Speech and behavior are normal.  ____________________________________________   LABS (all labs ordered are listed, but only abnormal results are displayed)  Labs Reviewed - No data to  display ____________________________________________  EKG  None ____________________________________________  RADIOLOGY  ED MD interpretation: None  Official radiology report(s): No results found.  ____________________________________________   PROCEDURES  Procedure(s) performed (including Critical Care):  .Splint Application  Date/Time: 12/31/2019 11:55 PM Performed by: Irean Hong, MD Authorized by: Irean Hong, MD   Consent:    Consent obtained:  Verbal   Consent given by:  Patient   Risks discussed:  Discoloration, numbness, pain and swelling   Alternatives discussed:  No treatment Pre-procedure details:    Sensation:  Normal   Skin color:  Pink Procedure details:    Laterality:  Left   Location:  Hand   Hand:  L hand   Splint type:  Sugar tong   Supplies:  Cotton padding and Ortho-Glass Post-procedure details:    Pain:  Unchanged (Patient denies pain)   Sensation:  Normal   Skin color:  Pink   Patient tolerance of procedure:  Tolerated well, no immediate complications     ____________________________________________   INITIAL IMPRESSION / ASSESSMENT AND PLAN / ED COURSE  As part of my medical decision making, I reviewed the following data within the electronic MEDICAL RECORD NUMBER Nursing notes reviewed and incorporated, Old chart reviewed and Notes from prior ED visits     [facility] was evaluated in Emergency Department on 12/31/2019 for the symptoms described in the history of present illness. He was evaluated in the context of the global COVID-19 pandemic, which necessitated consideration that the patient might be at risk for infection with the SARS-CoV-2 virus that causes COVID-19. Institutional protocols and algorithms that pertain to the evaluation of patients at risk for COVID-19 are in a state of rapid change based on information released by regulatory bodies including the CDC and federal and state organizations. These policies and  algorithms were followed during the patient's care in the ED.    44 year old male here for splint replacement after left metacarpal surgery.  Postoperative wounds appear to be healing nicely.  Splint replaced.  Patient will follow up with [facility] orthopedics as scheduled.  Strict return precautions given.  Patient verbalizes understanding and agrees with plan of care.      ____________________________________________   FINAL CLINICAL IMPRESSION(S) / ED DIAGNOSES  Final diagnoses:  Aftercare for cast or splint check or change     ED Discharge Orders    None       Note:  This document was prepared using Dragon voice recognition software and may include unintentional dictation errors.   Irean Hong, MD 01/01/20 [phone]

## 2020-01-09 ENCOUNTER — Other Ambulatory Visit: Payer: Self-pay

## 2020-01-09 ENCOUNTER — Emergency Department
Admission: EM | Admit: 2020-01-09 | Discharge: 2020-01-09 | Discharge status: Against Medical Advice | Payer: Medicaid Other | Attending: Emergency Medicine | Admitting: Emergency Medicine

## 2020-01-09 ENCOUNTER — Encounter: Payer: Self-pay | Admitting: Emergency Medicine

## 2020-01-09 DIAGNOSIS — Z5321 Procedure and treatment not carried out due to patient leaving prior to being seen by health care provider: Secondary | ICD-10-CM | POA: Insufficient documentation

## 2020-01-09 DIAGNOSIS — M79643 Pain in unspecified hand: Secondary | ICD-10-CM | POA: Insufficient documentation

## 2020-01-09 NOTE — ED Notes (Signed)
Pt has not returned to lobby 

## 2020-01-09 NOTE — ED Triage Notes (Signed)
Patient ambulatory to triage with steady gait, without difficulty or distress noted, mask in place; pt reports case replaced yesterday and is having discomfort, feels as if the cast is too tight; fingers W&D, brisk cap refill with no swelling noted

## 2020-01-09 NOTE — ED Notes (Signed)
Pt noted leaving ED lobby after inquiring about wait time

## 2020-01-10 ENCOUNTER — Emergency Department
Admission: EM | Admit: 2020-01-10 | Discharge: 2020-01-10 | Disposition: A | Discharge status: Home / Self Care | Payer: Medicaid Other | Attending: Emergency Medicine | Admitting: Emergency Medicine

## 2020-01-10 ENCOUNTER — Encounter: Payer: Self-pay | Admitting: Emergency Medicine

## 2020-01-10 ENCOUNTER — Other Ambulatory Visit: Payer: Self-pay

## 2020-01-10 DIAGNOSIS — X58XXXD Exposure to other specified factors, subsequent encounter: Secondary | ICD-10-CM | POA: Insufficient documentation

## 2020-01-10 DIAGNOSIS — F17210 Nicotine dependence, cigarettes, uncomplicated: Secondary | ICD-10-CM | POA: Insufficient documentation

## 2020-01-10 DIAGNOSIS — Z4789 Encounter for other orthopedic aftercare: Secondary | ICD-10-CM | POA: Diagnosis not present

## 2020-01-10 DIAGNOSIS — S62301D Unspecified fracture of second metacarpal bone, left hand, subsequent encounter for fracture with routine healing: Secondary | ICD-10-CM | POA: Insufficient documentation

## 2020-01-10 DIAGNOSIS — S6292XS Unspecified fracture of left wrist and hand, sequela: Secondary | ICD-10-CM

## 2020-01-10 NOTE — ED Triage Notes (Signed)
Pt states had splint replaced yesterday and it is too short and not comfortable.  States has already been replaced x 2.

## 2020-01-10 NOTE — ED Notes (Signed)
Pt left without discharge paperwork or instructions. Unable to reassess vitals or obtain signature.

## 2020-01-10 NOTE — ED Provider Notes (Addendum)
[facility] Emergency Department Provider Note   ____________________________________________   First MD Initiated Contact with Patient 01/10/20 1206     (approximate)  I have reviewed the triage vital signs and the nursing notes.   HISTORY  Chief Complaint Cast Problem    HPI Anthony Levine is a 44 y.o. male patient presents for splint replacement.  Patient said he had the splint replaced yesterday but is too short and not comfortable.  Patient stated splint has been been replaced to other times due to comfort.  Patient has a fracture of the left second metacarpal requiring internal fixations.  Patient is scheduled to see orthopedic on Feb 05, 2020.  Patient denies loss of sensation.  Patient denies pain at this time.         Past Medical History:  Diagnosis Date  . Blood clot in abdominal vein     There are no problems to display for this patient.   Past Surgical History:  Procedure Laterality Date  . blood clot removal from abdomen      Prior to Admission medications   Medication Sig Start Date End Date Taking? Authorizing Provider  acetic acid-hydrocortisone (VOSOL-HC) OTIC solution Place 4 drops into both ears 2 (two) times daily. 12/01/19   Triplett, Rulon Eisenmenger B, FNP  Cetirizine HCl 10 MG CAPS Take 1 capsule (10 mg total) by mouth daily. 12/01/19   Chinita Pester, FNP    Allergies Tylenol [acetaminophen]  History reviewed. No pertinent family history.  Social History Social History   Tobacco Use  . Smoking status: Current Some Day Smoker    Packs/day: 0.10    Types: Cigarettes  . Smokeless tobacco: Never Used  Substance Use Topics  . Alcohol use: Yes  . Drug use: Yes    Types: Marijuana    Review of Systems Constitutional: No fever/chills Eyes: No visual changes. ENT: No sore throat. Cardiovascular: Denies chest pain. Respiratory: Denies shortness of breath. Gastrointestinal: No abdominal pain.  No nausea, no  vomiting.  No diarrhea.  No constipation. Genitourinary: Negative for dysuria. Musculoskeletal: Healing second metacarpal fracture left hand. Skin: Negative for rash. Neurological: Negative for headaches, focal weakness or numbness. Allergic/Immunilogical: Tylenol ____________________________________________   PHYSICAL EXAM:  VITAL SIGNS: ED Triage Vitals  Enc Vitals Group     BP 01/10/20 1024 (!) 146/92     Pulse Rate 01/10/20 1024 (!) 109     Resp 01/10/20 1024 18     Temp 01/10/20 1024 98.4 F (36.9 C)     Temp Source 01/10/20 1024 Oral     SpO2 01/10/20 1024 98 %     Weight 01/10/20 1025 172 lb 2.5 oz (78.1 kg)     Height 01/10/20 1025 5\' 11"  (1.803 m)     Head Circumference --      Peak Flow --      Pain Score 01/10/20 1025 0     Pain Loc --      Pain Edu? --      Excl. in GC? --    Constitutional: Alert and oriented. Well appearing and in no acute distress. Cardiovascular: Normal rate, regular rhythm. Grossly normal heart sounds.  Good peripheral circulation. Respiratory: Normal respiratory effort.  No retractions. Lungs CTAB. Gastrointestinal: Soft and nontender. No distention. No abdominal bruits. No CVA tenderness. Musculoskeletal: No obvious deformity or edema/erythema to the left hand.   Neurologic:  Normal speech and language. No gross focal neurologic deficits are appreciated. No gait instability. Skin:  Skin is warm, dry and intact. No rash noted. Psychiatric: Mood and affect are normal. Speech and behavior are normal.  ____________________________________________   LABS (all labs ordered are listed, but only abnormal results are displayed)  Labs Reviewed - No data to display ____________________________________________  EKG   ____________________________________________  RADIOLOGY  ED MD interpretation:    Official radiology report(s): No results found.  ____________________________________________   PROCEDURES  Procedure(s) performed  (including Critical Care):  .Splint Application  Date/Time: 01/10/2020 12:32 PM Performed by: Sable Feil, PA-C Authorized by: Sable Feil, PA-C   Consent:    Consent obtained:  Verbal   Consent given by:  Patient   Risks discussed:  Numbness, pain and swelling Pre-procedure details:    Sensation:  Normal Procedure details:    Laterality:  Left   Location:  Wrist   Wrist:  L wrist   Splint type:  Wrist   Supplies:  Prefabricated splint Post-procedure details:    Pain:  Unchanged   Sensation:  Normal   Patient tolerance of procedure:  Tolerated well, no immediate complications     ____________________________________________   INITIAL IMPRESSION / ASSESSMENT AND PLAN / ED COURSE  As part of my medical decision making, I reviewed the following data within the [place] second left metacarpal fracture.  Patient splint was replaced.  Patient advised follow-up with schedule orthopedic appointment.    [name] was evaluated in Emergency Department on 01/10/2020 for the symptoms described in the history of present illness. He was evaluated in the context of the global COVID-19 pandemic, which necessitated consideration that the patient might be at risk for infection with the SARS-CoV-2 virus that causes COVID-19. Institutional protocols and algorithms that pertain to the evaluation of patients at risk for COVID-19 are in a state of rapid change based on information released by regulatory bodies including the CDC and federal and state organizations. These policies and algorithms were followed during the patient's care in the ED.       ____________________________________________   FINAL CLINICAL IMPRESSION(S) / ED DIAGNOSES  Final diagnoses:  Hand fracture, left, sequela     ED Discharge Orders    None       Note:  This document was prepared using Dragon voice recognition software and may include unintentional  dictation errors.    Sable Feil, PA-C 01/10/20 1226    Sable Feil, PA-C 01/10/20 1233    Delman Kitten, MD 01/10/20 1606

## 2020-01-10 NOTE — ED Notes (Signed)
This note has been inputted on the behalf of [place], [place]. EDT was asked by Ron, PA to place a sugar tong ocl on pt. Once in room and process was explained to pt. Pt complained stating that the sugar tong cast was not the cast he had on. EDT explained to pt that this cast was the one ordered. Pt asked EDT to talk to PA. EDT went to talk to PA. PA stated to place ocl on top and bottom of arm (similar to a volar.) EDT went back into room and pt was happy with the new cast type. EDT started placing cotton on pt and pt continuously adjusted cotton stating it was not in the same place it was before on the first cast. EDT explained that cast procedure was different at different facilities. Pt stated he was unhappy with how ocl was being applied. EDT went to ask PA to talk to pt. PA went to talk to pt. Pt voices that he desired a certain cast in a certain way. PA instructed EDT to get the cast on and he would see about getting pt to outpt ortho. EDT applied ocl and after which pt proceeded to take it off and reapplied it himself. Taking cotton off and putting it back on and cutting ocl to his liking ignoring EDT and statements about the cast and reapplying it. Pt completed placing cast. EDT dismissed himself and informed PA of what had happened.

## 2020-01-10 NOTE — Discharge Instructions (Addendum)
Wear splint and follow-up with schedule orthopedic appointment

## 2020-06-03 ENCOUNTER — Encounter: Payer: Self-pay | Admitting: Emergency Medicine

## 2020-06-03 ENCOUNTER — Emergency Department
Admission: EM | Admit: 2020-06-03 | Discharge: 2020-06-03 | Discharge status: Against Medical Advice | Payer: Medicaid Other | Attending: Emergency Medicine | Admitting: Emergency Medicine

## 2020-06-03 ENCOUNTER — Other Ambulatory Visit: Payer: Self-pay

## 2020-06-03 DIAGNOSIS — Z79899 Other long term (current) drug therapy: Secondary | ICD-10-CM | POA: Insufficient documentation

## 2020-06-03 DIAGNOSIS — R42 Dizziness and giddiness: Secondary | ICD-10-CM | POA: Insufficient documentation

## 2020-06-03 DIAGNOSIS — R55 Syncope and collapse: Secondary | ICD-10-CM | POA: Insufficient documentation

## 2020-06-03 DIAGNOSIS — F17210 Nicotine dependence, cigarettes, uncomplicated: Secondary | ICD-10-CM | POA: Insufficient documentation

## 2020-06-03 DIAGNOSIS — F1590 Other stimulant use, unspecified, uncomplicated: Secondary | ICD-10-CM | POA: Insufficient documentation

## 2020-06-03 LAB — CBC WITH DIFFERENTIAL/PLATELET
Abs Immature Granulocytes: 0.04 10*3/uL (ref 0.00–0.07)
Basophils Absolute: 0 10*3/uL (ref 0.0–0.1)
Basophils Relative: 0 %
Eosinophils Absolute: 0 10*3/uL (ref 0.0–0.5)
Eosinophils Relative: 0 %
HCT: 40.2 % (ref 39.0–52.0)
Hemoglobin: 14.2 g/dL (ref 13.0–17.0)
Immature Granulocytes: 0 %
Lymphocytes Relative: 21 %
Lymphs Abs: 2.3 10*3/uL (ref 0.7–4.0)
MCH: 30.1 pg (ref 26.0–34.0)
MCHC: 35.3 g/dL (ref 30.0–36.0)
MCV: 85.2 fL (ref 80.0–100.0)
Monocytes Absolute: 0.6 10*3/uL (ref 0.1–1.0)
Monocytes Relative: 6 %
Neutro Abs: 7.6 10*3/uL (ref 1.7–7.7)
Neutrophils Relative %: 73 %
Platelets: 246 10*3/uL (ref 150–400)
RBC: 4.72 MIL/uL (ref 4.22–5.81)
RDW: 16.8 % — ABNORMAL HIGH (ref 11.5–15.5)
WBC: 10.6 10*3/uL — ABNORMAL HIGH (ref 4.0–10.5)
nRBC: 0 % (ref 0.0–0.2)

## 2020-06-03 LAB — COMPREHENSIVE METABOLIC PANEL
ALT: 14 U/L (ref 0–44)
AST: 17 U/L (ref 15–41)
Albumin: 3.7 g/dL (ref 3.5–5.0)
Alkaline Phosphatase: 55 U/L (ref 38–126)
Anion gap: 12 (ref 5–15)
BUN: 12 mg/dL (ref 6–20)
CO2: 25 mmol/L (ref 22–32)
Calcium: 9 mg/dL (ref 8.9–10.3)
Chloride: 105 mmol/L (ref 98–111)
Creatinine, Ser: 0.9 mg/dL (ref 0.61–1.24)
GFR calc Af Amer: >60 mL/min (ref >60)
GFR calc non Af Amer: >60 mL/min (ref >60)
Glucose, Bld: 101 mg/dL — ABNORMAL HIGH (ref 70–99)
Potassium: 4 mmol/L (ref 3.5–5.1)
Sodium: 142 mmol/L (ref 135–145)
Total Bilirubin: 0.6 mg/dL (ref 0.3–1.2)
Total Protein: 6.3 g/dL — ABNORMAL LOW (ref 6.5–8.1)

## 2020-06-03 LAB — URINALYSIS, COMPLETE (UACMP) WITH MICROSCOPIC
Bacteria, UA: NONE SEEN
Bilirubin Urine: NEGATIVE
Glucose, UA: NEGATIVE mg/dL
Hgb urine dipstick: NEGATIVE
Ketones, ur: NEGATIVE mg/dL
Leukocytes,Ua: NEGATIVE
Nitrite: NEGATIVE
Protein, ur: NEGATIVE mg/dL
Specific Gravity, Urine: 1.02 (ref 1.005–1.030)
Squamous Epithelial / HPF: NONE SEEN (ref 0–5)
pH: 5 (ref 5.0–8.0)

## 2020-06-03 NOTE — ED Notes (Signed)
Pt reports this am he had coffee and went to shave and then passed out. Pt states he wasn't all the way out but was out of it for approximately 3 second. Pt denies injuries when he fell. Pt reports has a hx of blacking out as well.

## 2020-06-03 NOTE — ED Provider Notes (Signed)
[facility] Emergency Department Provider Note   ____________________________________________    I have reviewed the triage vital signs and the nursing notes.   HISTORY  Chief Complaint syncope     HPI Anthony Levine is a 44 y.o. male who presents after a syncopal episode today.  Patient reports he was shaving his neck when he felt lightheaded and slumped to the floor.  He felt better relatively quickly.  He denies any chest pain.  No head injury.  No shortness of breath.  No Covid symptoms.  No nausea or vomiting.  No dizziness currently.  No palpitations.  No symptoms of the same.  Past Medical History:  Diagnosis Date  . Blood clot in abdominal vein     There are no problems to display for this patient.   Past Surgical History:  Procedure Laterality Date  . blood clot removal from abdomen      Prior to Admission medications   Medication Sig Start Date End Date Taking? Authorizing Provider  acetic acid-hydrocortisone (VOSOL-HC) OTIC solution Place 4 drops into both ears 2 (two) times daily. 12/01/19   Triplett, Rulon Eisenmenger B, FNP  Cetirizine HCl 10 MG CAPS Take 1 capsule (10 mg total) by mouth daily. 12/01/19   Kem Boroughs B, FNP     Allergies Tylenol [acetaminophen]  No family history on file.  Social History Social History   Tobacco Use  . Smoking status: Current Some Day Smoker    Packs/day: 0.10    Types: Cigarettes  . Smokeless tobacco: Never Used  Vaping Use  . Vaping Use: Never used  Substance Use Topics  . Alcohol use: Yes  . Drug use: Yes    Types: Marijuana    Review of Systems  Constitutional: No fever/chills Eyes: No visual changes.  ENT: No sore throat. Cardiovascular: Denies chest pain. Respiratory: Denies shortness of breath. Gastrointestinal: No abdominal pain Genitourinary: Negative for dysuria. Musculoskeletal: Negative for back pain. Skin: Negative for rash. Neurological: Negative for  headaches   ____________________________________________   PHYSICAL EXAM:  VITAL SIGNS: ED Triage Vitals  Enc Vitals Group     BP 06/03/20 1420 (!) 159/91     Pulse Rate 06/03/20 1420 65     Resp 06/03/20 1420 15     Temp 06/03/20 1420 97.9 F (36.6 C)     Temp Source 06/03/20 1420 Oral     SpO2 06/03/20 1420 100 %     Weight 06/03/20 1420 74.8 kg (165 lb)     Height 06/03/20 1420 1.803 m (5\' 11" )     Head Circumference --      Peak Flow --      Pain Score 06/03/20 1429 0     Pain Loc --      Pain Edu? --      Excl. in GC? --     Constitutional: Alert and oriented. Eyes: Conjunctivae are normal.  Head: Atraumatic. Nose: No congestion/rhinnorhea. Mouth/Throat: Mucous membranes are moist.   Neck:  Painless ROM Cardiovascular: Normal rate, regular rhythm.  Respiratory: Normal respiratory effort.  No retractions. Gastrointestinal: Soft and nontender. No distention.  Musculoskeletal:   Warm and well perfused Neurologic:  Normal speech and language.  Skin:  Skin is warm, dry and intact. No rash noted. Psychiatric: Mood and affect are normal. Speech and behavior are normal.  ____________________________________________   LABS (all labs ordered are listed, but only abnormal results are displayed)  Labs Reviewed  CBC WITH DIFFERENTIAL/PLATELET - Abnormal; Notable  for the following components:      Result Value   WBC 10.6 (*)    RDW 16.8 (*)    All other components within normal limits  COMPREHENSIVE METABOLIC PANEL   ____________________________________________  EKG  ED ECG REPORT I, Jene Every, the attending physician, personally viewed and interpreted this ECG.  Date: 06/03/2020  Rhythm: Sinus bradycardia QRS Axis: normal Intervals: normal ST/T Wave abnormalities: normal Narrative Interpretation: no evidence of acute  ischemia  ____________________________________________  RADIOLOGY  None ____________________________________________   PROCEDURES  Procedure(s) performed: No  Procedures   Critical Care performed: No ____________________________________________   INITIAL IMPRESSION / ASSESSMENT AND PLAN / ED COURSE  Pertinent labs & imaging results that were available during my care of the patient were reviewed by me and considered in my medical decision making (see chart for details).  Patient presents after a near syncopal episode this morning.  It occurred while shaving.  Suspicious for vasovagal syncope.  Patient became very irate with staff when they were attempting to take his blood.  He then became frustrated with me and decided to leave for reasons that are not entirely clear.    ____________________________________________   FINAL CLINICAL IMPRESSION(S) / ED DIAGNOSES  Syncope, vasovagal     Note:  This document was prepared using Dragon voice recognition software and may include unintentional dictation errors.   Jene Every, MD 06/03/20 [phone]

## 2020-06-03 NOTE — ED Triage Notes (Signed)
First Nurse Note:  Patient left AMA from patient room just prior to re-checking in. Patient states he needs blood work because he passed out earlier this morning.  Patient is AAOx3.  Skin warm and dry . MAE equally and strong.  Patient has been ambulating in and out and through out the ED waiting area since arrival.  NAD

## 2020-06-03 NOTE — ED Triage Notes (Signed)
C/O syncope this morning.  Arrives c/o dizziness.  AAOx3.  Skin warm and dry. NAD

## 2020-06-03 NOTE — ED Notes (Signed)
This tech was unable to get labs on first attempt as the pt complained of needle was hurting him and asked to take it out. It was offered to re-try again pt stated, "You are not about to stick me again."

## 2020-06-03 NOTE — ED Notes (Signed)
1305 patient seen walking back into the ED from the outside.

## 2020-06-03 NOTE — ED Notes (Signed)
Called patient to have blood drawn.  No Answer.

## 2020-06-04 ENCOUNTER — Emergency Department
Admission: EM | Admit: 2020-06-04 | Discharge: 2020-06-04 | Disposition: A | Discharge status: Home / Self Care | Payer: Medicaid Other | Source: Home / Self Care

## 2020-06-04 LAB — TROPONIN I (HIGH SENSITIVITY): Troponin I (High Sensitivity): 7 ng/L (ref <18)

## 2020-06-04 NOTE — ED Notes (Signed)
No answer when called 

## 2021-01-11 ENCOUNTER — Ambulatory Visit: Payer: Self-pay | Admitting: Surgery

## 2021-01-11 NOTE — H&P (Signed)
Subjective:   CC: Umbilical hernia without obstruction and without gangrene [K42.9]  HPI:  Anthony Levine is a 45 y.o. male who was referred by Ngwe Achirimofor Aycock* for evaluation of above. Symptoms were first noted 2 years ago. Pain is discomfort, confined to the periumbilical area, without radiation.  Associated with nothing specific, exacerbated by nothing specific.  Lump is reducible.    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  Past Surgical History:  Past Surgical History:  Procedure Laterality Date  . ABDOMINAL SURGERY     1998, blood clot in abdominal vein  . FRACTURE SURGERY     thumb fracture left hand with ORIF    Family History: family history includes No Known Problems in his mother.  Social History:  reports that he has been smoking. He has never used smokeless tobacco. He reports current alcohol use. He reports that he does not use drugs.  Current Medications: has a current medication list which includes the following prescription(s): cetirizine, gabapentin, and hydrocortisone.  Allergies:  Allergies as of 01/11/2021 - Reviewed 01/11/2021  Allergen Reaction Noted  . Acetaminophen Vomiting and Nausea And Vomiting 03/10/2014    ROS:  A 15 point review of systems was performed and pertinent positives and negatives noted in HPI   Objective:     BP (!) 159/98   Pulse 72   Ht 180.3 cm (5\' 11" )   Wt 77.1 kg (170 lb)   BMI 23.71 kg/m   Constitutional :  alert, appears stated age, cooperative and no distress  Lymphatics/Throat:  no asymmetry, masses, or scars  Respiratory:  clear to auscultation bilaterally  Cardiovascular:  regular rate and rhythm  Gastrointestinal: soft, non-tender; bowel sounds normal; no masses,  no organomegaly. umbilical hernia noted.  small, reducible and no overlying skin changes   Musculoskeletal: Steady gait and movement  Skin: Cool and moist  Psychiatric: Normal affect, non-agitated, not confused       LABS:   n/a  RADS: n/a Assessment:       Umbilical hernia without obstruction and without gangrene [K42.9]  Plan:     1. Umbilical hernia without obstruction and without gangrene [K42.9]   Discussed the risk of surgery including recurrence, which can be up to 50% in the case of incisional or complex hernias, possible use of prosthetic materials (mesh) and the increased risk of mesh infxn if used, bleeding, chronic pain, post-op infxn, post-op SBO or ileus, and possible re-operation to address said risks. The risks of general anesthetic, if used, includes MI, CVA, sudden death or even reaction to anesthetic medications also discussed. Alternatives include continued observation.  Benefits include possible symptom relief, prevention of incarceration, strangulation, enlargement in size over time, and the risk of emergency surgery in the face of strangulation.   Typical post-op recovery time of 3-5 days with 2 weeks of activity restrictions were also discussed.  ED return precautions given for sudden increase in pain, size of hernia with accompanying fever, nausea, and/or vomiting.  The patient verbalized understanding and all questions were answered to the patient's satisfaction.   2. Patient has elected to proceed with surgical treatment. Procedure will be scheduled.  Written consent was obtained. Open primary repair, NO MESH per patient request, and doubt we will need it due to small size

## 2021-02-04 ENCOUNTER — Other Ambulatory Visit
Admission: RE | Admit: 2021-02-04 | Discharge: 2021-02-04 | Disposition: A | Discharge status: Home / Self Care | Payer: Medicaid Other | Source: Ambulatory Visit | Attending: Surgery | Admitting: Surgery

## 2021-02-04 ENCOUNTER — Other Ambulatory Visit: Payer: Self-pay

## 2021-02-04 HISTORY — DX: Personal history of urinary calculi: Z87.442

## 2021-02-09 ENCOUNTER — Other Ambulatory Visit: Payer: Medicaid Other

## 2021-02-11 ENCOUNTER — Other Ambulatory Visit: Payer: Self-pay

## 2021-02-11 ENCOUNTER — Ambulatory Visit: Payer: Medicaid Other | Admitting: Anesthesiology

## 2021-02-11 ENCOUNTER — Encounter
Admission: RE | Disposition: A | Discharge status: Home / Self Care | Payer: Self-pay | Source: Home / Self Care | Attending: Surgery

## 2021-02-11 ENCOUNTER — Ambulatory Visit
Admission: RE | Admit: 2021-02-11 | Discharge: 2021-02-11 | Disposition: A | Discharge status: Home / Self Care | Payer: Medicaid Other | Attending: Surgery | Admitting: Surgery

## 2021-02-11 ENCOUNTER — Encounter: Payer: Self-pay | Admitting: Surgery

## 2021-02-11 DIAGNOSIS — F17200 Nicotine dependence, unspecified, uncomplicated: Secondary | ICD-10-CM | POA: Insufficient documentation

## 2021-02-11 DIAGNOSIS — K429 Umbilical hernia without obstruction or gangrene: Secondary | ICD-10-CM | POA: Insufficient documentation

## 2021-02-11 HISTORY — PX: UMBILICAL HERNIA REPAIR: SHX196

## 2021-02-11 LAB — URINE DRUG SCREEN, QUALITATIVE (ARMC ONLY)
Amphetamines, Ur Screen: NOT DETECTED
Barbiturates, Ur Screen: NOT DETECTED
Benzodiazepine, Ur Scrn: NOT DETECTED
Cannabinoid 50 Ng, Ur ~~LOC~~: POSITIVE — AB
Cocaine Metabolite,Ur ~~LOC~~: NOT DETECTED
MDMA (Ecstasy)Ur Screen: NOT DETECTED
Methadone Scn, Ur: NOT DETECTED
Opiate, Ur Screen: NOT DETECTED
Phencyclidine (PCP) Ur S: NOT DETECTED
Tricyclic, Ur Screen: NOT DETECTED

## 2021-02-11 SURGERY — REPAIR, HERNIA, UMBILICAL, ADULT
Anesthesia: General | Site: Abdomen

## 2021-02-11 MED ORDER — SUCCINYLCHOLINE CHLORIDE 200 MG/10ML IV SOSY
PREFILLED_SYRINGE | INTRAVENOUS | Status: AC
  Filled 2021-02-11: qty 10

## 2021-02-11 MED ORDER — BUPIVACAINE-EPINEPHRINE (PF) 0.5% -1:200000 IJ SOLN
INTRAMUSCULAR | Status: AC
  Filled 2021-02-11: qty 30

## 2021-02-11 MED ORDER — FENTANYL CITRATE (PF) 100 MCG/2ML IJ SOLN: 25.0000 ug | INTRAMUSCULAR | Status: DC | PRN

## 2021-02-11 MED ORDER — BUPIVACAINE LIPOSOME 1.3 % IJ SUSP
INTRAMUSCULAR | Status: DC | PRN
  Administered 2021-02-11: 20 mL

## 2021-02-11 MED ORDER — FAMOTIDINE 20 MG PO TABS: 20.0000 mg | ORAL_TABLET | Freq: Once | ORAL | Status: AC

## 2021-02-11 MED ORDER — CEFAZOLIN SODIUM-DEXTROSE 2-4 GM/100ML-% IV SOLN
2.0000 g | INTRAVENOUS | Status: AC
  Administered 2021-02-11: 2 g via INTRAVENOUS

## 2021-02-11 MED ORDER — ROCURONIUM BROMIDE 10 MG/ML (PF) SYRINGE
PREFILLED_SYRINGE | INTRAVENOUS | Status: AC
  Filled 2021-02-11: qty 10

## 2021-02-11 MED ORDER — CHLORHEXIDINE GLUCONATE 0.12 % MT SOLN: 15.0000 mL | Freq: Once | OROMUCOSAL | Status: AC

## 2021-02-11 MED ORDER — ONDANSETRON HCL 4 MG/2ML IJ SOLN
INTRAMUSCULAR | Status: AC
  Filled 2021-02-11: qty 2

## 2021-02-11 MED ORDER — EPHEDRINE 5 MG/ML INJ
INTRAVENOUS | Status: AC
  Filled 2021-02-11: qty 10

## 2021-02-11 MED ORDER — DEXAMETHASONE SODIUM PHOSPHATE 10 MG/ML IJ SOLN
INTRAMUSCULAR | Status: AC
  Filled 2021-02-11: qty 1

## 2021-02-11 MED ORDER — CEFAZOLIN SODIUM-DEXTROSE 2-4 GM/100ML-% IV SOLN
INTRAVENOUS | Status: AC
  Filled 2021-02-11: qty 100

## 2021-02-11 MED ORDER — FENTANYL CITRATE (PF) 100 MCG/2ML IJ SOLN
INTRAMUSCULAR | Status: DC | PRN
  Administered 2021-02-11 (×2): 50 ug via INTRAVENOUS

## 2021-02-11 MED ORDER — KETOROLAC TROMETHAMINE 30 MG/ML IJ SOLN
INTRAMUSCULAR | Status: AC
  Filled 2021-02-11: qty 1

## 2021-02-11 MED ORDER — BUPIVACAINE-EPINEPHRINE (PF) 0.5% -1:200000 IJ SOLN
INTRAMUSCULAR | Status: DC | PRN
  Administered 2021-02-11: 7 mL

## 2021-02-11 MED ORDER — CELECOXIB 200 MG PO CAPS
ORAL_CAPSULE | ORAL | Status: AC
  Administered 2021-02-11: 200 mg via ORAL
  Filled 2021-02-11: qty 1

## 2021-02-11 MED ORDER — BUPIVACAINE LIPOSOME 1.3 % IJ SUSP
INTRAMUSCULAR | Status: AC
  Filled 2021-02-11: qty 20

## 2021-02-11 MED ORDER — CELECOXIB 200 MG PO CAPS: 200.0000 mg | ORAL_CAPSULE | ORAL | Status: AC

## 2021-02-11 MED ORDER — SEVOFLURANE IN SOLN
RESPIRATORY_TRACT | Status: AC
  Filled 2021-02-11: qty 250

## 2021-02-11 MED ORDER — ORAL CARE MOUTH RINSE: 15.0000 mL | Freq: Once | OROMUCOSAL | Status: AC

## 2021-02-11 MED ORDER — LACTATED RINGERS IV SOLN: INTRAVENOUS | Status: DC

## 2021-02-11 MED ORDER — LIDOCAINE HCL (CARDIAC) PF 100 MG/5ML IV SOSY
PREFILLED_SYRINGE | INTRAVENOUS | Status: DC | PRN
  Administered 2021-02-11: 100 mg via INTRAVENOUS

## 2021-02-11 MED ORDER — MIDAZOLAM HCL 2 MG/2ML IJ SOLN
INTRAMUSCULAR | Status: DC | PRN
  Administered 2021-02-11: 2 mg via INTRAVENOUS

## 2021-02-11 MED ORDER — FAMOTIDINE 20 MG PO TABS
ORAL_TABLET | ORAL | Status: AC
  Administered 2021-02-11: 20 mg via ORAL
  Filled 2021-02-11: qty 1

## 2021-02-11 MED ORDER — DOCUSATE SODIUM 100 MG PO CAPS: 100.0000 mg | ORAL_CAPSULE | Freq: Two times a day (BID) | ORAL | 0 refills | Status: AC | PRN

## 2021-02-11 MED ORDER — PHENYLEPHRINE HCL (PRESSORS) 10 MG/ML IV SOLN
INTRAVENOUS | Status: AC
  Filled 2021-02-11: qty 1

## 2021-02-11 MED ORDER — DEXAMETHASONE SODIUM PHOSPHATE 10 MG/ML IJ SOLN
INTRAMUSCULAR | Status: DC | PRN
  Administered 2021-02-11: 5 mg via INTRAVENOUS

## 2021-02-11 MED ORDER — PROPOFOL 10 MG/ML IV BOLUS
INTRAVENOUS | Status: AC
  Filled 2021-02-11: qty 20

## 2021-02-11 MED ORDER — MIDAZOLAM HCL 2 MG/2ML IJ SOLN
INTRAMUSCULAR | Status: AC
  Filled 2021-02-11: qty 2

## 2021-02-11 MED ORDER — LIDOCAINE HCL (PF) 2 % IJ SOLN
INTRAMUSCULAR | Status: AC
  Filled 2021-02-11: qty 5

## 2021-02-11 MED ORDER — ONDANSETRON HCL 4 MG/2ML IJ SOLN: 4.0000 mg | Freq: Once | INTRAMUSCULAR | Status: DC | PRN

## 2021-02-11 MED ORDER — GABAPENTIN 300 MG PO CAPS: 300.0000 mg | ORAL_CAPSULE | ORAL | Status: AC

## 2021-02-11 MED ORDER — CHLORHEXIDINE GLUCONATE 0.12 % MT SOLN
OROMUCOSAL | Status: AC
  Administered 2021-02-11: 15 mL via OROMUCOSAL
  Filled 2021-02-11: qty 15

## 2021-02-11 MED ORDER — ROCURONIUM BROMIDE 100 MG/10ML IV SOLN
INTRAVENOUS | Status: DC | PRN
  Administered 2021-02-11: 50 mg via INTRAVENOUS

## 2021-02-11 MED ORDER — TRAMADOL HCL 50 MG PO TABS: 50.0000 mg | ORAL_TABLET | Freq: Four times a day (QID) | ORAL | 0 refills | Status: DC | PRN

## 2021-02-11 MED ORDER — IBUPROFEN 800 MG PO TABS: 800.0000 mg | ORAL_TABLET | Freq: Three times a day (TID) | ORAL | 0 refills | Status: DC | PRN

## 2021-02-11 MED ORDER — GABAPENTIN 300 MG PO CAPS
ORAL_CAPSULE | ORAL | Status: AC
  Administered 2021-02-11: 300 mg via ORAL
  Filled 2021-02-11: qty 1

## 2021-02-11 MED ORDER — CHLORHEXIDINE GLUCONATE CLOTH 2 % EX PADS: 6.0000 | MEDICATED_PAD | Freq: Once | CUTANEOUS | Status: DC

## 2021-02-11 MED ORDER — FENTANYL CITRATE (PF) 100 MCG/2ML IJ SOLN
INTRAMUSCULAR | Status: AC
  Filled 2021-02-11: qty 2

## 2021-02-11 MED ORDER — PROPOFOL 10 MG/ML IV BOLUS
INTRAVENOUS | Status: DC | PRN
  Administered 2021-02-11: 200 mg via INTRAVENOUS

## 2021-02-11 SURGICAL SUPPLY — 33 items
ADH SKN CLS APL DERMABOND .7 (GAUZE/BANDAGES/DRESSINGS) ×1
APL PRP STRL LF DISP 70% ISPRP (MISCELLANEOUS) ×1
BLADE SURG 15 STRL LF DISP TIS (BLADE) ×1 IMPLANT
BLADE SURG 15 STRL SS (BLADE) ×2
CANISTER SUCT 1200ML W/VALVE (MISCELLANEOUS) IMPLANT
CHLORAPREP W/TINT 26 (MISCELLANEOUS) ×2 IMPLANT
COVER WAND RF STERILE (DRAPES) ×2 IMPLANT
DERMABOND ADVANCED (GAUZE/BANDAGES/DRESSINGS) ×1
DERMABOND ADVANCED .7 DNX12 (GAUZE/BANDAGES/DRESSINGS) ×1 IMPLANT
DRAPE LAPAROTOMY 77X122 PED (DRAPES) ×2 IMPLANT
ELECT REM PT RETURN 9FT ADLT (ELECTROSURGICAL) ×2
ELECTRODE REM PT RTRN 9FT ADLT (ELECTROSURGICAL) ×1 IMPLANT
GLOVE SURG SYN 6.5 ES PF (GLOVE) ×4 IMPLANT
GLOVE SURG UNDER POLY LF SZ7 (GLOVE) ×4 IMPLANT
GOWN STRL REUS W/ TWL LRG LVL3 (GOWN DISPOSABLE) ×3 IMPLANT
GOWN STRL REUS W/TWL LRG LVL3 (GOWN DISPOSABLE) ×6
KIT TURNOVER KIT A (KITS) ×2 IMPLANT
LABEL OR SOLS (LABEL) ×2 IMPLANT
MANIFOLD NEPTUNE II (INSTRUMENTS) ×2 IMPLANT
NEEDLE HYPO 22GX1.5 SAFETY (NEEDLE) ×2 IMPLANT
NS IRRIG 500ML POUR BTL (IV SOLUTION) ×2 IMPLANT
PACK BASIN MINOR ARMC (MISCELLANEOUS) ×2 IMPLANT
SUT ETHIBOND NAB MO 7 #0 18IN (SUTURE) ×2 IMPLANT
SUT MNCRL 4-0 (SUTURE) ×2
SUT MNCRL 4-0 27XMFL (SUTURE) ×1
SUT VIC AB 2-0 SH 27 (SUTURE) ×2
SUT VIC AB 2-0 SH 27XBRD (SUTURE) ×1 IMPLANT
SUT VIC AB 3-0 SH 27 (SUTURE) ×2
SUT VIC AB 3-0 SH 27X BRD (SUTURE) ×1 IMPLANT
SUTURE MNCRL 4-0 27XMF (SUTURE) ×1 IMPLANT
SYR 10ML LL (SYRINGE) ×4 IMPLANT
TOWEL OR 17X26 4PK STRL BLUE (TOWEL DISPOSABLE) ×2 IMPLANT
WATER STERILE IRR 1000ML POUR (IV SOLUTION) ×2 IMPLANT

## 2021-02-11 NOTE — Anesthesia Procedure Notes (Signed)
Procedure Name: Intubation Date/Time: 02/11/2021 7:38 AM Performed by: Zetta Bills, CRNA Pre-anesthesia Checklist: Patient identified, Emergency Drugs available, Suction available and Patient being monitored Patient Re-evaluated:Patient Re-evaluated prior to induction Oxygen Delivery Method: Circle system utilized Preoxygenation: Pre-oxygenation with 100% oxygen Induction Type: IV induction Ventilation: Mask ventilation with difficulty Laryngoscope Size: Mac and 4 Grade View: Grade I Tube type: Oral Tube size: 7.0 mm Number of attempts: 1 Placement Confirmation: ETT inserted through vocal cords under direct vision Secured at: 22 cm Tube secured with: Tape Dental Injury: Teeth and Oropharynx as per pre-operative assessment

## 2021-02-11 NOTE — Anesthesia Preprocedure Evaluation (Signed)
Anesthesia Evaluation  Patient identified by MRN, date of birth, ID band Patient awake    Reviewed: Allergy & Precautions, NPO status , Patient's Chart, lab work & pertinent test results  History of Anesthesia Complications Negative for: history of anesthetic complications  Airway Mallampati: II       Dental   Pulmonary neg sleep apnea, neg COPD, Current Smoker,           Cardiovascular (-) hypertension(-) Past MI and (-) CHF (-) dysrhythmias (-) Valvular Problems/Murmurs     Neuro/Psych neg Seizures    GI/Hepatic Neg liver ROS, neg GERD  ,  Endo/Other  neg diabetes  Renal/GU negative Renal ROS     Musculoskeletal   Abdominal   Peds  Hematology   Anesthesia Other Findings   Reproductive/Obstetrics                             Anesthesia Physical Anesthesia Plan  ASA: II  Anesthesia Plan: General   Post-op Pain Management:    Induction: Intravenous  PONV Risk Score and Plan: 1 and Ondansetron  Airway Management Planned: Oral ETT  Additional Equipment:   Intra-op Plan:   Post-operative Plan:   Informed Consent: I have reviewed the patients History and Physical, chart, labs and discussed the procedure including the risks, benefits and alternatives for the proposed anesthesia with the patient or authorized representative who has indicated his/her understanding and acceptance.       Plan Discussed with:   Anesthesia Plan Comments:         Anesthesia Quick Evaluation

## 2021-02-11 NOTE — Interval H&P Note (Signed)
History and Physical Interval Note:  02/11/2021 7:20 AM  Shmiel [facility]  has presented today for surgery, with the diagnosis of Umbilical hernia without obstruction and without gangrene K42.9.  The various methods of treatment have been discussed with the patient and family. After consideration of risks, benefits and other options for treatment, the patient has consented to  Procedure(s): HERNIA REPAIR UMBILICAL ADULT (N/A) as a surgical intervention.  The patient's history has been reviewed, patient examined, no change in status, stable for surgery.  I have reviewed the patient's chart and labs.  Questions were answered to the patient's satisfaction.     Jamelle Noy Tonna Boehringer

## 2021-02-11 NOTE — Transfer of Care (Signed)
Immediate Anesthesia Transfer of Care Note  Patient: [facility]  Procedure(s) Performed: HERNIA REPAIR UMBILICAL ADULT (N/A Abdomen)  Patient Location: PACU  Anesthesia Type:General  Level of Consciousness: awake  Airway & Oxygen Therapy: Patient Spontanous Breathing  Post-op Assessment: Report given to RN  Post vital signs: stable  Last Vitals:  Vitals Value Taken Time  BP 143/101 02/11/21 0828  Temp    Pulse 69 02/11/21 0829  Resp 16 02/11/21 0829  SpO2 100 % 02/11/21 0829  Vitals shown include unvalidated device data.  Last Pain:  Vitals:   02/11/21 0640  TempSrc: Oral  PainSc: 0-No pain         Complications: No complications documented.

## 2021-02-11 NOTE — Discharge Instructions (Signed)
Information for Discharge Teaching:  DO NOT REMOVE TEAL BRACELET  EXPAREL (bupivacaine liposome injectable suspension)   Your surgeon or anesthesiologist gave you EXPAREL(bupivacaine) to help control your pain after surgery.   EXPAREL is a local anesthetic that provides pain relief by numbing the tissue around the surgical site.  EXPAREL is designed to release pain medication over time and can control pain for up to 72 hours.  Depending on how you respond to EXPAREL, you may require less pain medication during your recovery.  Possible side effects:  Temporary loss of sensation or ability to move in the area where bupivacaine was injected.  Nausea, vomiting, constipation  Rarely, numbness and tingling in your mouth or lips, lightheadedness, or anxiety may occur.  Call your doctor right away if you think you may be experiencing any of these sensations, or if you have other questions regarding possible side effects.  Follow all other discharge instructions given to you by your surgeon or nurse. Eat a healthy diet and drink plenty of water or other fluids.  If you return to the hospital for any reason within 96 hours following the administration of EXPAREL, it is important for health care providers to know that you have received this anesthetic. A teal colored band has been placed on your arm with the date, time and amount of EXPAREL you have received in order to alert and inform your health care providers. Please leave this armband in place for the full 96 hours following administration, and then you may remove the band.   Hernia repair, Care After This sheet gives you information about how to care for yourself after your procedure. Your health care provider may also give you more specific instructions. If you have problems or questions, contact your health care provider. What can I expect after the procedure? After your procedure, it is common to have the following:  Pain in your  abdomen, especially in the incision areas. You will be given medicine to control the pain.  Tiredness. This is a normal part of the recovery process. Your energy level will return to normal over the next several weeks.  Changes in your bowel movements, such as constipation or needing to go more often. Talk with your health care provider about how to manage this. Follow these instructions at home: Medicines   advil as needed for discomfort.   Use narcotics, if prescribed, only when  motrin is not enough to control pain.    Advil up to 800mg  per dose every 8hrs as needed for pain.    PLEASE RECORD NUMBER OF PILLS TAKEN UNTIL NEXT FOLLOW UP APPT.  THIS WILL HELP DETERMINE HOW READY YOU ARE TO BE RELEASED FROM ANY ACTIVITY RESTRICTIONS  Do not drive or use heavy machinery while taking prescription pain medicine.  Do not drink alcohol while taking prescription pain medicine.  Incision care     Follow instructions from your health care provider about how to take care of your incision areas. Make sure you: ? Keep your incisions clean and dry. ? Wash your hands with soap and water before and after applying medicine to the areas, and before and after changing your bandage (dressing). If soap and water are not available, use hand sanitizer. ? Change your dressing as told by your health care provider. ? Leave stitches (sutures), skin glue, or adhesive strips in place. These skin closures may need to stay in place for 2 weeks or longer. If adhesive strip edges start to loosen and curl  up, you may trim the loose edges. Do not remove adhesive strips completely unless your health care provider tells you to do that.  Do not wear tight clothing over the incisions. Tight clothing may rub and irritate the incision areas, which may cause the incisions to open.  Do not take baths, swim, or use a hot tub until your health care provider approves. OK TO SHOWER IN 24HRS.    Check your incision area every day  for signs of infection. Check for: ? More redness, swelling, or pain. ? More fluid or blood. ? Warmth. ? Pus or a bad smell. Activity  Avoid lifting anything that is heavier than 10 lb (4.5 kg) for 2 weeks or until your health care provider says it is okay.  No pushing/pulling greater than 30lbs  You may resume normal activities as told by your health care provider. Ask your health care provider what activities are safe for you.  Take rest breaks during the day as needed. Eating and drinking  Follow instructions from your health care provider about what you can eat after surgery.  To prevent or treat constipation while you are taking prescription pain medicine, your health care provider may recommend that you: ? Drink enough fluid to keep your urine clear or pale yellow. ? Take over-the-counter or prescription medicines. ? Eat foods that are high in fiber, such as fresh fruits and vegetables, whole grains, and beans. ? Limit foods that are high in fat and processed sugars, such as fried and sweet foods. General instructions  Ask your health care provider when you will need an appointment to get your sutures or staples removed.  Keep all follow-up visits as told by your health care provider. This is important. Contact a health care provider if:  You have more redness, swelling, or pain around your incisions.  You have more fluid or blood coming from the incisions.  Your incisions feel warm to the touch.  You have pus or a bad smell coming from your incisions or your dressing.  You have a fever.  You have an incision that breaks open (edges not staying together) after sutures or staples have been removed. Get help right away if:  You develop a rash.  You have chest pain or difficulty breathing.  You have pain or swelling in your legs.  You feel light-headed or you faint.  Your abdomen swells (becomes distended).  You have nausea or vomiting.  You have blood in  your stool (feces). This information is not intended to replace advice given to you by your health care provider. Make sure you discuss any questions you have with your health care provider. Document Released: 04/07/2005 Document Revised: 06/07/2018 Document Reviewed: 06/19/2016 Elsevier Interactive Patient Education  2019 [employer]    AMBULATORY SURGERY  DISCHARGE INSTRUCTIONS   1) The drugs that you were given will stay in your system until tomorrow so for the next 24 hours you should not:  A) Drive an automobile B) Make any legal decisions C) Drink any alcoholic beverage   2) You may resume regular meals tomorrow.  Today it is better to start with liquids and gradually work up to solid foods.  You may eat anything you prefer, but it is better to start with liquids, then soup and crackers, and gradually work up to solid foods.   3) Please notify your doctor immediately if you have any unusual bleeding, trouble breathing, redness and pain at the surgery site, drainage, fever, or pain  not relieved by medication.    4) Additional Instructions:        Please contact your physician with any problems or Same Day Surgery at [phone], Monday through Friday 6 am to 4 pm, or Post at [facility] number at [phone].

## 2021-02-11 NOTE — Op Note (Signed)
Preoperative diagnosis: umbilical hernia, initial, reducible Postoperative diagnosis: same  Procedure:  Open umbilical hernia repair  Anesthesia: LMA  Surgeon: Sung Amabile  Wound Classification: Clean  Specimen: none  Complications: None  Estimated Blood Loss: minimal  Indications:see HPI  Findings: 1. 1.5cm x 1cm  umbilical hernia 2. Tension free repair achieved with suture 3. Adequate hemostasis  Description of procedure: The patient was brought to the operating room and general anesthesia was induced. A time-out was completed verifying correct patient, procedure, site, positioning, and implant(s) and/or special equipment prior to beginning this procedure. Antibiotics were administered prior to making the incision. SCDs placed. The anterior abdominal wall was prepped and draped in the standard sterile fashion.   An infraumbilical incision was made after infusing the preplanned incision with half percent Marcaine.  Dissection carried down to fascia where the umbilical stalk was noted.  The stalk was transected and there was noted to be a 1.5cm x 1cm umbilical hernia.  The preperitoneal fat contents were dissected off the surrounding structures and reduced.  Hemostasis was confirmed prior to reducing the actual contents.  The defect itself was primary closed using 0 Ethibonds in an interrupted fashion.  The fascia as well as the skin incision was then infused with exparel.  After confirming hemostasis, the umbilical stalk was reattached to the abdominal wall using 2-0 Vicryl and the wound was irrigated and closed in a multilayer fashion, using 3-0 Vicryl for the deep dermal layer in an interrupted fashion and running 4-0 Monocryl in a subcuticular fashion.  Wound was then dressed with Dermabond.  Patient was then successfully awakened and transferred to PACU in stable condition.  At the end of the procedure sponge and instrument counts were correct
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
## 2021-02-12 ENCOUNTER — Encounter: Payer: Self-pay | Admitting: Emergency Medicine

## 2021-02-12 ENCOUNTER — Emergency Department
Admission: EM | Admit: 2021-02-12 | Discharge: 2021-02-12 | Disposition: A | Discharge status: Home / Self Care | Payer: Medicaid Other | Attending: Emergency Medicine | Admitting: Emergency Medicine

## 2021-02-12 ENCOUNTER — Other Ambulatory Visit: Payer: Self-pay

## 2021-02-12 DIAGNOSIS — Z711 Person with feared health complaint in whom no diagnosis is made: Secondary | ICD-10-CM | POA: Diagnosis present

## 2021-02-12 DIAGNOSIS — F17210 Nicotine dependence, cigarettes, uncomplicated: Secondary | ICD-10-CM | POA: Insufficient documentation
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
## 2021-02-12 NOTE — ED Notes (Signed)
Nonadherent dressing applied to mid abdominal incision site
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
## 2021-02-14 ENCOUNTER — Encounter: Payer: Self-pay | Admitting: Surgery

## 2021-02-14 NOTE — Anesthesia Postprocedure Evaluation (Signed)
Anesthesia Post Note  Patient: [employer]  Procedure(s) Performed: HERNIA REPAIR UMBILICAL ADULT (N/A Abdomen)  Patient location during evaluation: PACU Anesthesia Type: General Level of consciousness: awake and alert and oriented Pain management: pain level controlled Vital Signs Assessment: post-procedure vital signs reviewed and stable Respiratory status: spontaneous breathing Cardiovascular status: blood pressure returned to baseline Anesthetic complications: no   No complications documented.   Last Vitals:  Vitals:   02/11/21 0900 02/11/21 0920  BP: (!) 139/96 (!) 147/90  Pulse: (!) 58 (!) 53  Resp: 11   Temp: 36.6 C   SpO2: 100%     Last Pain:  Vitals:   02/11/21 0900  TempSrc:   PainSc: 0-No pain                 Trinisha Paget

## 2021-04-21 ENCOUNTER — Emergency Department
Admission: EM | Admit: 2021-04-21 | Discharge: 2021-04-21 | Disposition: A | Discharge status: Home / Self Care | Payer: Medicaid Other | Attending: Student in an Organized Health Care Education/Training Program | Admitting: Student in an Organized Health Care Education/Training Program

## 2021-04-21 ENCOUNTER — Encounter: Payer: Self-pay | Admitting: Emergency Medicine

## 2021-04-21 ENCOUNTER — Other Ambulatory Visit: Payer: Self-pay

## 2021-04-21 DIAGNOSIS — R222 Localized swelling, mass and lump, trunk: Secondary | ICD-10-CM | POA: Diagnosis present

## 2021-04-21 DIAGNOSIS — B3783 Candidal cheilitis: Secondary | ICD-10-CM | POA: Insufficient documentation

## 2021-04-21 DIAGNOSIS — L02214 Cutaneous abscess of groin: Secondary | ICD-10-CM | POA: Insufficient documentation

## 2021-04-21 DIAGNOSIS — R112 Nausea with vomiting, unspecified: Secondary | ICD-10-CM | POA: Diagnosis not present

## 2021-04-21 DIAGNOSIS — F17210 Nicotine dependence, cigarettes, uncomplicated: Secondary | ICD-10-CM | POA: Insufficient documentation

## 2021-04-21 DIAGNOSIS — L02818 Cutaneous abscess of other sites: Secondary | ICD-10-CM

## 2021-04-21 DIAGNOSIS — K130 Diseases of lips: Secondary | ICD-10-CM

## 2021-04-21 MED ORDER — MUPIROCIN CALCIUM 2 % EX CREA: TOPICAL_CREAM | CUTANEOUS | 0 refills | Status: DC

## 2021-04-21 MED ORDER — SULFAMETHOXAZOLE-TRIMETHOPRIM 800-160 MG PO TABS: 1.0000 | ORAL_TABLET | Freq: Two times a day (BID) | ORAL | 0 refills | Status: DC
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
## 2021-04-21 NOTE — ED Triage Notes (Signed)
Spider bite to right groin/ thigh area x 1 day.

## 2021-04-21 NOTE — Discharge Instructions (Addendum)
Follow-up with [facility], [facility] or [facility] for any continued problems.  Begin using warm moist compresses to your groin area which may result in you getting a lot more drainage from this area.  A prescription for antibiotics was sent to your pharmacy along with a cream to use to the corner of your lip.  You may take Tylenol or ibuprofen as needed for pain.  Return to the emergency department if any fever or chills or severe worsening of your symptoms.

## 2021-04-21 NOTE — ED Notes (Signed)
AAOx3.  Skin warm and dry.  NAD 

## 2021-07-22 ENCOUNTER — Encounter: Payer: Self-pay | Admitting: Emergency Medicine

## 2021-07-22 ENCOUNTER — Other Ambulatory Visit: Payer: Self-pay

## 2021-07-22 ENCOUNTER — Emergency Department
Admission: EM | Admit: 2021-07-22 | Discharge: 2021-07-22 | Disposition: A | Discharge status: Home / Self Care | Payer: Medicaid Other | Attending: Emergency Medicine | Admitting: Emergency Medicine

## 2021-07-22 DIAGNOSIS — I10 Essential (primary) hypertension: Secondary | ICD-10-CM | POA: Diagnosis not present

## 2021-07-22 DIAGNOSIS — N342 Other urethritis: Secondary | ICD-10-CM | POA: Insufficient documentation

## 2021-07-22 DIAGNOSIS — F17210 Nicotine dependence, cigarettes, uncomplicated: Secondary | ICD-10-CM | POA: Diagnosis not present

## 2021-07-22 DIAGNOSIS — R300 Dysuria: Secondary | ICD-10-CM | POA: Diagnosis present

## 2021-07-22 LAB — URINALYSIS, ROUTINE W REFLEX MICROSCOPIC
Bacteria, UA: NONE SEEN
Bilirubin Urine: NEGATIVE
Glucose, UA: NEGATIVE mg/dL
Ketones, ur: NEGATIVE mg/dL
Nitrite: NEGATIVE
Protein, ur: NEGATIVE mg/dL
RBC / HPF: >50 RBC/hpf — ABNORMAL HIGH (ref 0–5)
Specific Gravity, Urine: 1.025 (ref 1.005–1.030)
pH: 5 (ref 5.0–8.0)

## 2021-07-22 LAB — CHLAMYDIA/NGC RT PCR (ARMC ONLY)
Chlamydia Tr: NOT DETECTED
N gonorrhoeae: NOT DETECTED

## 2021-07-22 MED ORDER — CEFTRIAXONE SODIUM 1 G IJ SOLR
500.0000 mg | Freq: Once | INTRAMUSCULAR | Status: AC
  Administered 2021-07-22: 500 mg via INTRAMUSCULAR
  Filled 2021-07-22: qty 10

## 2021-07-22 MED ORDER — DOXYCYCLINE HYCLATE 100 MG PO CAPS: 100.0000 mg | ORAL_CAPSULE | Freq: Two times a day (BID) | ORAL | 0 refills | Status: AC

## 2021-07-22 NOTE — Discharge Instructions (Addendum)
Her blood pressure was also noted to be elevated today.  Please have this rechecked by your primary care doctor in the next 2 to 3 days.

## 2021-07-22 NOTE — ED Triage Notes (Signed)
Patient ambulatory to triage with steady gait, without difficulty or distress noted; pt reports dysuria and hematuria since last night; denies any abd/back pain, denies penile discharge
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
## 2021-07-23 LAB — URINE CULTURE: Culture: <10000 — AB

## 2021-10-15 ENCOUNTER — Emergency Department
Admission: EM | Admit: 2021-10-15 | Discharge: 2021-10-15 | Disposition: A | Discharge status: Home / Self Care | Payer: Medicaid Other | Attending: Emergency Medicine | Admitting: Emergency Medicine

## 2021-10-15 ENCOUNTER — Other Ambulatory Visit: Payer: Self-pay

## 2021-10-15 ENCOUNTER — Encounter: Payer: Self-pay | Admitting: Emergency Medicine

## 2021-10-15 DIAGNOSIS — R030 Elevated blood-pressure reading, without diagnosis of hypertension: Secondary | ICD-10-CM | POA: Insufficient documentation

## 2021-10-15 DIAGNOSIS — K0889 Other specified disorders of teeth and supporting structures: Secondary | ICD-10-CM | POA: Diagnosis present

## 2021-10-15 DIAGNOSIS — K047 Periapical abscess without sinus: Secondary | ICD-10-CM | POA: Diagnosis not present

## 2021-10-15 MED ORDER — IBUPROFEN 600 MG PO TABS: 600.0000 mg | ORAL_TABLET | Freq: Four times a day (QID) | ORAL | 0 refills | Status: DC | PRN

## 2021-10-15 MED ORDER — AMOXICILLIN 875 MG PO TABS: 875.0000 mg | ORAL_TABLET | Freq: Two times a day (BID) | ORAL | 0 refills | Status: DC

## 2021-10-15 NOTE — ED Triage Notes (Signed)
C/o dental abscess to left upper jaw x 1 day

## 2021-10-15 NOTE — ED Notes (Signed)
Left side of face by nasal fold is swollen. Patient denies pain.

## 2021-10-15 NOTE — ED Provider Notes (Signed)
[facility] Provider Note    Event Date/Time   First MD Initiated Contact with Patient 10/15/21 1130     (approximate)   History   Dental Pain   HPI  Anthony Levine is a 46 y.o. male presents to the ED with complaint of dental pain for 1 day patient.  Denies any fever or chills.  He states that he had a filling put in that area over the summer.  Patient reports a history of kidney stones.  He rates pain as 6 out of 10.     Physical Exam   Triage Vital Signs: ED Triage Vitals  Enc Vitals Group     BP 10/15/21 1107 (!) 152/103     Pulse Rate 10/15/21 1107 87     Resp 10/15/21 1107 16     Temp 10/15/21 1107 98.3 F (36.8 C)     Temp Source 10/15/21 1107 Oral     SpO2 10/15/21 1107 99 %     Weight 10/15/21 1104 175 lb 0.7 oz (79.4 kg)     Height 10/15/21 1104 5\' 11"  (1.803 m)     Head Circumference --      Peak Flow --      Pain Score 10/15/21 1103 6     Pain Loc --      Pain Edu? --      Excl. in GC? --     Most recent vital signs: Vitals:   10/15/21 1107  BP: (!) 152/103  Pulse: 87  Resp: 16  Temp: 98.3 F (36.8 C)  SpO2: 99%     General: Awake, no distress.  CV:  Good peripheral perfusion.  Heart regular rate and rhythm without murmur. Resp:  Normal effort.  Clear bilaterally. Abd:  No distention.  Other:  On examination of the mouth there is on the left upper molars a tooth with a filling which also appears to have a possible fracture at the enamel and where the filling starts.  No active drainage.  Gums are tender.   ED Results / Procedures / Treatments   Labs (all labs ordered are listed, but only abnormal results are displayed) Labs Reviewed - No data to display   PROCEDURES:  Critical Care performed: No  Procedures   MEDICATIONS ORDERED IN ED: Medications - No data to display   IMPRESSION / MDM / ASSESSMENT AND PLAN / ED COURSE  I reviewed the triage vital signs and the nursing  notes.   Differential diagnosis includes, but is not limited to, dental carry, dental abscess, dental injury, gingivitis.  46 year old male presents to the ED with complaint of left upper dental pain for 1 day.  Patient has an area which is a possible fracture near where a filling was placed this summer.  Patient is afebrile.  Currently his blood pressure is elevated which could be elevated due to his pain.  He is encouraged to [facility] to have his blood pressure rechecked and monitored.  Patient was started on amoxicillin 875 twice daily and ibuprofen as needed for pain.  He is encouraged to call his dentist on Monday.         FINAL CLINICAL IMPRESSION(S) / ED DIAGNOSES   Final diagnoses:  Dental abscess  Elevated blood pressure reading     Rx / DC Orders   ED Discharge Orders          Ordered    amoxicillin (AMOXIL) 875 MG tablet  2 times  daily        10/15/21 1138    ibuprofen (ADVIL) 600 MG tablet  Every 6 hours PRN        10/15/21 1138             Note:  This document was prepared using Dragon voice recognition software and may include unintentional dictation errors.   Tommi Rumps, PA-C 10/15/21 1150    Arnaldo Natal, MD 10/15/21 1257

## 2021-10-15 NOTE — Discharge Instructions (Signed)
Call your dentist on Monday to make an appointment to be seen.  Begin taking antibiotics until completely finished and ibuprofen every 6 hours with food if needed for pain.  Also follow-up with your primary care provider to have your blood pressure rechecked which was elevated at 152/103.  This may be due to pain from your tooth but still should be rechecked.

## 2022-09-22 ENCOUNTER — Encounter: Payer: Self-pay | Admitting: Emergency Medicine

## 2022-09-22 ENCOUNTER — Emergency Department
Admission: EM | Admit: 2022-09-22 | Discharge: 2022-09-22 | Disposition: A | Discharge status: Home / Self Care | Payer: Medicaid Other | Attending: Emergency Medicine | Admitting: Emergency Medicine

## 2022-09-22 ENCOUNTER — Emergency Department: Payer: Medicaid Other

## 2022-09-22 ENCOUNTER — Other Ambulatory Visit: Payer: Self-pay

## 2022-09-22 DIAGNOSIS — J101 Influenza due to other identified influenza virus with other respiratory manifestations: Secondary | ICD-10-CM | POA: Insufficient documentation

## 2022-09-22 DIAGNOSIS — Z1152 Encounter for screening for COVID-19: Secondary | ICD-10-CM | POA: Insufficient documentation

## 2022-09-22 DIAGNOSIS — R059 Cough, unspecified: Secondary | ICD-10-CM | POA: Diagnosis present

## 2022-09-22 LAB — RESP PANEL BY RT-PCR (RSV, FLU A&B, COVID)  RVPGX2
Influenza A by PCR: POSITIVE — AB
Influenza B by PCR: NEGATIVE
Resp Syncytial Virus by PCR: NEGATIVE
SARS Coronavirus 2 by RT PCR: NEGATIVE

## 2022-09-22 LAB — GROUP A STREP BY PCR: Group A Strep by PCR: NOT DETECTED

## 2022-09-22 MED ORDER — IBUPROFEN 800 MG PO TABS
800.0000 mg | ORAL_TABLET | Freq: Once | ORAL | Status: AC
  Administered 2022-09-22: 800 mg via ORAL
  Filled 2022-09-22: qty 1

## 2022-09-22 MED ORDER — OSELTAMIVIR PHOSPHATE 75 MG PO CAPS: 75.0000 mg | ORAL_CAPSULE | Freq: Two times a day (BID) | ORAL | 0 refills | Status: AC

## 2022-09-22 NOTE — ED Triage Notes (Signed)
Patient ambulatory to triage with steady gait, without difficulty or distress noted; pt reports since Wed having cough, congestion and HA

## 2022-09-22 NOTE — ED Provider Notes (Signed)
[facility] Provider Note    Event Date/Time   First MD Initiated Contact with Patient 09/22/22 [phone]     (approximate)   History   Cough   HPI  Anthony Levine is a 46 y.o. male presents emergency department with cough, fever and headache for 2 days.  Patient states symptoms started on Wednesday.  Took Aleve yesterday for the headache.  States headache continues.  Did not take Tylenol or Aleve this morning.  No vomiting or diarrhea      Physical Exam   Triage Vital Signs: ED Triage Vitals  Enc Vitals Group     BP 09/22/22 0714 (!) 148/92     Pulse Rate 09/22/22 0714 97     Resp 09/22/22 0714 16     Temp 09/22/22 0714 (!) 100.5 F (38.1 C)     Temp Source 09/22/22 0714 Oral     SpO2 09/22/22 0714 92 %     Weight 09/22/22 0659 180 lb (81.6 kg)     Height 09/22/22 0659 5\' 11"  (1.803 m)     Head Circumference --      Peak Flow --      Pain Score 09/22/22 0659 10     Pain Loc --      Pain Edu? --      Excl. in GC? --     Most recent vital signs: Vitals:   09/22/22 0714  BP: (!) 148/92  Pulse: 97  Resp: 16  Temp: (!) 100.5 F (38.1 C)  SpO2: 92%     General: Awake, no distress.   CV:  Good peripheral perfusion. regular rate and  rhythm Resp:  Normal effort. Lungs cta Abd:  No distention.   Other:  Throat is red, swollen, neck is supple, no lymphadenopathy   ED Results / Procedures / Treatments   Labs (all labs ordered are listed, but only abnormal results are displayed) Labs Reviewed  RESP PANEL BY RT-PCR (RSV, FLU A&B, COVID)  RVPGX2 - Abnormal; Notable for the following components:      Result Value   Influenza A by PCR POSITIVE (*)    All other components within normal limits  GROUP A STREP BY PCR     EKG     RADIOLOGY Chest x-ray    PROCEDURES:   Procedures   MEDICATIONS ORDERED IN ED: Medications  ibuprofen (ADVIL) tablet 800 mg (800 mg Oral Given 09/22/22 0750)     IMPRESSION / MDM /  ASSESSMENT AND PLAN / ED COURSE  I reviewed the triage vital signs and the nursing notes.                              Differential diagnosis includes, but is not limited to, COVID, influenza, RSV, strep throat, CAP  Patient's presentation is most consistent with acute complicated illness / injury requiring diagnostic workup.   Chest x-ray independently reviewed and interpreted by me as being negative for any acute abnormality  Respiratory panel, strep test ordered, patient was given ibuprofen for fever and headache   Respiratory panel positive for influenza A, strep test reassuring  Patient appears to be very stable do not feel that he needs further workup.  I did explain findings to the patient.  He was given a prescription for Tamiflu 75 mg twice daily for 5 days.  He is to follow-up with his regular doctor if not improving 3 to 4 days.  Return if worsening.  He is in agreement treatment plan.  He encouraged to drink plenty of fluids.  Take over-the-counter medications for his cough.  Discharged in stable condition.     FINAL CLINICAL IMPRESSION(S) / ED DIAGNOSES   Final diagnoses:  Influenza A     Rx / DC Orders   ED Discharge Orders          Ordered    oseltamivir (TAMIFLU) 75 MG capsule  2 times daily        09/22/22 0825             Note:  This document was prepared using Dragon voice recognition software and may include unintentional dictation errors.    Faythe Ghee, PA-C 09/22/22 8208    Chesley Noon, MD 09/22/22 1146

## 2022-09-22 NOTE — ED Notes (Signed)
See triage note  Presents with cough and h/a for couple of days  Low grade temp on arrival

## 2023-04-04 ENCOUNTER — Encounter: Payer: Self-pay | Admitting: Radiology

## 2023-04-04 ENCOUNTER — Emergency Department: Payer: MEDICAID

## 2023-04-04 ENCOUNTER — Other Ambulatory Visit: Payer: Self-pay

## 2023-04-04 ENCOUNTER — Inpatient Hospital Stay
Admission: EM | Admit: 2023-04-04 | Discharge: 2023-04-06 | DRG: 694 | Disposition: A | Discharge status: Home / Self Care | Payer: MEDICAID | Attending: Hospitalist | Admitting: Hospitalist

## 2023-04-04 DIAGNOSIS — N200 Calculus of kidney: Principal | ICD-10-CM

## 2023-04-04 DIAGNOSIS — N132 Hydronephrosis with renal and ureteral calculous obstruction: Secondary | ICD-10-CM | POA: Diagnosis not present

## 2023-04-04 DIAGNOSIS — D72829 Elevated white blood cell count, unspecified: Secondary | ICD-10-CM | POA: Diagnosis present

## 2023-04-04 DIAGNOSIS — F17210 Nicotine dependence, cigarettes, uncomplicated: Secondary | ICD-10-CM | POA: Diagnosis present

## 2023-04-04 DIAGNOSIS — Z5941 Food insecurity: Secondary | ICD-10-CM

## 2023-04-04 DIAGNOSIS — Z886 Allergy status to analgesic agent status: Secondary | ICD-10-CM

## 2023-04-04 LAB — COMPREHENSIVE METABOLIC PANEL
ALT: 15 U/L (ref 0–44)
AST: 29 U/L (ref 15–41)
Albumin: 4.5 g/dL (ref 3.5–5.0)
Alkaline Phosphatase: 71 U/L (ref 38–126)
Anion gap: 14 (ref 5–15)
BUN: 18 mg/dL (ref 6–20)
CO2: 25 mmol/L (ref 22–32)
Calcium: 10 mg/dL (ref 8.9–10.3)
Chloride: 102 mmol/L (ref 98–111)
Creatinine, Ser: 1.24 mg/dL (ref 0.61–1.24)
GFR, Estimated: >60 mL/min (ref >60)
Glucose, Bld: 136 mg/dL — ABNORMAL HIGH (ref 70–99)
Potassium: 3.7 mmol/L (ref 3.5–5.1)
Sodium: 141 mmol/L (ref 135–145)
Total Bilirubin: 0.7 mg/dL (ref 0.3–1.2)
Total Protein: 7.9 g/dL (ref 6.5–8.1)

## 2023-04-04 LAB — URINALYSIS, ROUTINE W REFLEX MICROSCOPIC
Bacteria, UA: NONE SEEN
Bilirubin Urine: NEGATIVE
Glucose, UA: NEGATIVE mg/dL
Ketones, ur: 20 mg/dL — AB
Leukocytes,Ua: NEGATIVE
Nitrite: NEGATIVE
Protein, ur: 100 mg/dL — AB
Specific Gravity, Urine: 1.02 (ref 1.005–1.030)
pH: 6 (ref 5.0–8.0)

## 2023-04-04 LAB — CBC
HCT: 43 % (ref 39.0–52.0)
Hemoglobin: 14.3 g/dL (ref 13.0–17.0)
MCH: 28.6 pg (ref 26.0–34.0)
MCHC: 33.3 g/dL (ref 30.0–36.0)
MCV: 86 fL (ref 80.0–100.0)
Platelets: 247 10*3/uL (ref 150–400)
RBC: 5 MIL/uL (ref 4.22–5.81)
RDW: 16.9 % — ABNORMAL HIGH (ref 11.5–15.5)
WBC: 14.1 10*3/uL — ABNORMAL HIGH (ref 4.0–10.5)
nRBC: 0 % (ref 0.0–0.2)

## 2023-04-04 LAB — LIPASE, BLOOD: Lipase: 29 U/L (ref 11–51)

## 2023-04-04 MED ORDER — MORPHINE SULFATE (PF) 4 MG/ML IV SOLN: 4.0000 mg | Freq: Once | INTRAVENOUS | Status: DC

## 2023-04-04 MED ORDER — TAMSULOSIN HCL 0.4 MG PO CAPS
0.4000 mg | ORAL_CAPSULE | Freq: Every day | ORAL | Status: DC
  Administered 2023-04-05: 0.4 mg via ORAL
  Filled 2023-04-04: qty 1

## 2023-04-04 MED ORDER — MORPHINE SULFATE (PF) 4 MG/ML IV SOLN
4.0000 mg | Freq: Once | INTRAVENOUS | Status: AC
  Administered 2023-04-04: 4 mg via INTRAVENOUS
  Filled 2023-04-04: qty 1

## 2023-04-04 MED ORDER — MORPHINE SULFATE (PF) 2 MG/ML IV SOLN
2.0000 mg | INTRAVENOUS | Status: DC | PRN
  Administered 2023-04-04 – 2023-04-05 (×2): 2 mg via INTRAVENOUS
  Filled 2023-04-04 (×2): qty 1

## 2023-04-04 MED ORDER — LACTATED RINGERS IV SOLN: INTRAVENOUS | Status: AC

## 2023-04-04 MED ORDER — KETOROLAC TROMETHAMINE 30 MG/ML IJ SOLN: 30.0000 mg | Freq: Four times a day (QID) | INTRAMUSCULAR | Status: DC | PRN

## 2023-04-04 MED ORDER — ONDANSETRON HCL 4 MG/2ML IJ SOLN: 4.0000 mg | Freq: Four times a day (QID) | INTRAMUSCULAR | Status: DC | PRN

## 2023-04-04 MED ORDER — ACETAMINOPHEN 650 MG RE SUPP: 650.0000 mg | Freq: Four times a day (QID) | RECTAL | Status: DC | PRN

## 2023-04-04 MED ORDER — SODIUM CHLORIDE 0.9 % IV BOLUS
1000.0000 mL | Freq: Once | INTRAVENOUS | Status: AC
  Administered 2023-04-04: 1000 mL via INTRAVENOUS

## 2023-04-04 MED ORDER — ONDANSETRON HCL 4 MG/2ML IJ SOLN
4.0000 mg | INTRAMUSCULAR | Status: AC
  Administered 2023-04-04: 4 mg via INTRAVENOUS
  Filled 2023-04-04: qty 2

## 2023-04-04 MED ORDER — KETOROLAC TROMETHAMINE 30 MG/ML IJ SOLN
30.0000 mg | Freq: Once | INTRAMUSCULAR | Status: AC
  Administered 2023-04-04: 30 mg via INTRAVENOUS
  Filled 2023-04-04: qty 1

## 2023-04-04 MED ORDER — ACETAMINOPHEN 325 MG PO TABS: 650.0000 mg | ORAL_TABLET | Freq: Four times a day (QID) | ORAL | Status: DC | PRN

## 2023-04-04 MED ORDER — ONDANSETRON HCL 4 MG PO TABS: 4.0000 mg | ORAL_TABLET | Freq: Four times a day (QID) | ORAL | Status: DC | PRN

## 2023-04-04 NOTE — Assessment & Plan Note (Addendum)
CT showing subtle right hydronephrosis secondary to a proximal right ureteric 5x7 mm stone No evidence of infection at this time, with reassuring urinalysis Admitted for pain control and IV antiemetics Toradol and morphine for breakthrough and will add Flomax.  Antiemetics Urology consulted Will keep n.p.o. tonight in case of procedure

## 2023-04-04 NOTE — ED Triage Notes (Signed)
Pt states he has had multiple episodes of emesis since this am. Pt complains on pain in his abdomen as well as his right side and back. Denies urinary symptoms.
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
## 2023-04-04 NOTE — ED Provider Notes (Signed)
[facility] Provider Note    Event Date/Time   First MD Initiated Contact with Patient 04/04/23 1610     (approximate)   History   Emesis and Abdominal Pain   HPI  Anthony Levine is a 47 y.o. male   reports no major past medical history   This morning patient had abrupt onset of pain in his right abdomen and some towards his flank.  Reports pain is severe he is having episodes of sweating nausea and vomiting.  Reports the pain is severe and sharp.  No fever.  Has not noticed any trouble with urination.  Advises Tylenol makes him nauseated   Physical Exam   Triage Vital Signs: ED Triage Vitals  Enc Vitals Group     BP 04/04/23 1547 (!) 163/95     Pulse Rate 04/04/23 1547 (!) 51     Resp 04/04/23 1547 17     Temp 04/04/23 1547 97.8 F (36.6 C)     Temp Source 04/04/23 1547 Oral     SpO2 04/04/23 1547 100 %     Weight 04/04/23 1548 180 lb (81.6 kg)     Height 04/04/23 1548 5\' 11"  (1.803 m)     Head Circumference --      Peak Flow --      Pain Score --      Pain Loc --      Pain Edu? --      Excl. in GC? --     Most recent vital signs: Vitals:   04/04/23 1547 04/04/23 1930  BP: (!) 163/95 (!) 148/78  Pulse: (!) 51 (!) 54  Resp: 17 18  Temp: 97.8 F (36.6 C)   SpO2: 100% 98%     General: Awake, peers in pain, actively dry heaving, diaphoretic CV:  Good peripheral perfusion.  Normal tones Resp:  Normal effort.  Abd:  No distention.  Pain not well reproduced by exam but does have tenderness to palpation in the deep right flank.  Mild discomfort to percussion of right Other:  ***   ED Results / Procedures / Treatments   Labs (all labs ordered are listed, but only abnormal results are displayed) Labs Reviewed  COMPREHENSIVE METABOLIC PANEL - Abnormal; Notable for the following components:      Result Value   Glucose, Bld 136 (*)    All other components within normal limits  CBC - Abnormal; Notable for the following  components:   WBC 14.1 (*)    RDW 16.9 (*)    All other components within normal limits  URINALYSIS, ROUTINE W REFLEX MICROSCOPIC - Abnormal; Notable for the following components:   Color, Urine YELLOW (*)    APPearance HAZY (*)    Hgb urine dipstick MODERATE (*)    Ketones, ur 20 (*)    Protein, ur 100 (*)    All other components within normal limits  LIPASE, BLOOD     EKG     RADIOLOGY  CT Renal Stone Study  Result Date: 04/04/2023 CLINICAL DATA:  Emesis.  Right-sided flank pain. EXAM: CT ABDOMEN AND PELVIS WITHOUT CONTRAST TECHNIQUE: Multidetector CT imaging of the abdomen and pelvis was performed following the standard protocol without IV contrast. RADIATION DOSE REDUCTION: This exam was performed according to the departmental dose-optimization program which includes automated exposure control, adjustment of the mA and/or kV according to patient size and/or use of iterative reconstruction technique. COMPARISON:  12/13/2019 FINDINGS: Lower chest: Clear lung bases. Borderline cardiomegaly, without  pericardial or pleural effusion. Hepatobiliary: Normal liver. Normal gallbladder, without biliary ductal dilatation. Pancreas: Normal, without mass or ductal dilatation. Spleen: Normal in size, without focal abnormality. Adrenals/Urinary Tract: Normal adrenal glands. No renal calculi. Subtle right hydronephrosis secondary to a proximal right ureteric 5x7 mm stone including on 36/2. No bladder calculi. Stomach/Bowel: Normal stomach, without wall thickening. Normal colon. Normal small bowel. Vascular/Lymphatic: Aortic atherosclerosis. No abdominopelvic adenopathy. Reproductive: Normal prostate. Other: No significant free fluid.  No free intraperitoneal air. Musculoskeletal: Lumbosacral spondylosis. IMPRESSION: 1. Mild right-sided hydronephrosis secondary to a proximal right ureteric stone. 2.  Aortic Atherosclerosis (ICD10-I70.0). Electronically Signed   By: Jeronimo Greaves M.D.   On: 04/04/2023 16:20       PROCEDURES:  Critical Care performed: No  Procedures   MEDICATIONS ORDERED IN ED: Medications  ketorolac (TORADOL) 30 MG/ML injection 30 mg (30 mg Intravenous Given 04/04/23 1642)  ondansetron (ZOFRAN) injection 4 mg (4 mg Intravenous Given 04/04/23 1642)  sodium chloride 0.9 % bolus 1,000 mL (0 mLs Intravenous Stopped 04/04/23 1911)  morphine (PF) 4 MG/ML injection 4 mg (4 mg Intravenous Given 04/04/23 1912)  ondansetron (ZOFRAN) injection 4 mg (4 mg Intravenous Given 04/04/23 1911)  morphine (PF) 4 MG/ML injection 4 mg (4 mg Intravenous Given 04/04/23 1925)     IMPRESSION / MDM / ASSESSMENT AND PLAN / ED COURSE  I reviewed the triage vital signs and the nursing notes.                              Differential diagnosis includes but is not limited to, abdominal perforation, aortic dissection, cholecystitis, appendicitis, diverticulitis, colitis, esophagitis/gastritis, kidney stone, pyelonephritis, urinary tract infection, aortic aneurysm. All are considered in decision and treatment plan. Based upon the patient's presentation and risk factors, given the abrupt onset of pain and sharpness and flank discomfort I suspect this may be urologic or other cause such as cholelithiasis, appendicitis, etc.  Upon review of patient's CT imaging he has a fairly large stone noted on the right side within the ureter.  Patient with fairly intractable pain after receiving Toradol, and a total of approximately 8 mg of morphine as well as antiemetic he continues to have nausea and dry heaves.  Pain is slowly improving but appears fairly intractable.  I have consulted with Dr. Apolinar Junes of urology.  Urology services available to see in consult on the patient, and I have placed consult.  Advise if pain intractable reasonable to admit to hospitalist with urology consult to follow  Discussed with patient, given the intractable nature of his pain ongoing nausea, and given the size of the stone and his response thus  far to a fairly heavy dose of morphine as well as Toradol, anticipate need for hospitalizations for ongoing pain control and urology consult.  Patient agreeable  Labs interpreted as leukocytosis.  Normal comprehensive metabolic panel and renal function.  Urinalysis with hematuria, no bacteria noted few white cells.  Patient's presentation is most consistent with acute complicated illness / injury requiring diagnostic workup.   Consulted with hospitalist Dr. Marland Kitchen       FINAL CLINICAL IMPRESSION(S) / ED DIAGNOSES   Final diagnoses:  Kidney stone on right side     Rx / DC Orders   ED Discharge Orders     None        Note:  This document was prepared using Dragon voice recognition software and may include unintentional dictation errors.

## 2023-04-05 ENCOUNTER — Encounter: Payer: Self-pay | Admitting: Hospitalist

## 2023-04-05 DIAGNOSIS — D72829 Elevated white blood cell count, unspecified: Secondary | ICD-10-CM | POA: Diagnosis present

## 2023-04-05 DIAGNOSIS — R109 Unspecified abdominal pain: Secondary | ICD-10-CM | POA: Diagnosis present

## 2023-04-05 DIAGNOSIS — N200 Calculus of kidney: Secondary | ICD-10-CM | POA: Diagnosis not present

## 2023-04-05 DIAGNOSIS — Z5941 Food insecurity: Secondary | ICD-10-CM | POA: Diagnosis not present

## 2023-04-05 DIAGNOSIS — N132 Hydronephrosis with renal and ureteral calculous obstruction: Secondary | ICD-10-CM | POA: Diagnosis present

## 2023-04-05 DIAGNOSIS — F17210 Nicotine dependence, cigarettes, uncomplicated: Secondary | ICD-10-CM | POA: Diagnosis present

## 2023-04-05 DIAGNOSIS — Z886 Allergy status to analgesic agent status: Secondary | ICD-10-CM | POA: Diagnosis not present

## 2023-04-05 LAB — CBC
HCT: 35 % — ABNORMAL LOW (ref 39.0–52.0)
Hemoglobin: 12 g/dL — ABNORMAL LOW (ref 13.0–17.0)
MCH: 28.6 pg (ref 26.0–34.0)
MCHC: 34.3 g/dL (ref 30.0–36.0)
MCV: 83.5 fL (ref 80.0–100.0)
Platelets: 205 10*3/uL (ref 150–400)
RBC: 4.19 MIL/uL — ABNORMAL LOW (ref 4.22–5.81)
RDW: 16.9 % — ABNORMAL HIGH (ref 11.5–15.5)
WBC: 10 10*3/uL (ref 4.0–10.5)
nRBC: 0 % (ref 0.0–0.2)

## 2023-04-05 LAB — HIV ANTIBODY (ROUTINE TESTING W REFLEX): HIV Screen 4th Generation wRfx: NONREACTIVE

## 2023-04-05 MED ORDER — LACTATED RINGERS IV SOLN: INTRAVENOUS | Status: DC

## 2023-04-05 MED ORDER — OXYCODONE HCL 5 MG PO TABS
5.0000 mg | ORAL_TABLET | ORAL | Status: DC | PRN
  Administered 2023-04-05: 5 mg via ORAL
  Filled 2023-04-05: qty 1

## 2023-04-05 MED ORDER — ENOXAPARIN SODIUM 40 MG/0.4ML IJ SOSY
40.0000 mg | PREFILLED_SYRINGE | INTRAMUSCULAR | Status: DC
  Administered 2023-04-05: 40 mg via SUBCUTANEOUS
  Filled 2023-04-05: qty 0.4

## 2023-04-05 NOTE — Discharge Instructions (Signed)
Food Resources  Agency Name: [facility] Agency Address: [address] [place], [place] [address] Phone: [phone] Website: [email or web address] Service(s) Offered: Housing services, self-sufficiency, congregate meal program, weatherization program, Event organiser program, emergency food assistance,  housing counseling, home ownership program, wheels - to work program.  [employer] free for 60 and older at various locations from [employer], Monday-Friday:  [employer], [address] [place], [phone] -[facility], [address] [place] [phone]  -[facility], [address], [place] [phone]  -[address], [place], [phone]  Agency Name: [facility] [place] [facility] Address: [phone] [address], [place], [place] [address] Phone: [phone] Website: www.[email or web address] Service(s) Offered: Home delivered hot, frozen, and emergency  meals. Grocery assistance program which matches  volunteers one-on-one with seniors unable to grocery shop  for themselves. Must be 60 years and older; less than 20  hours of in-home aide service, limited or no driving ability;  live alone or with someone with a disability; live in  [place].  Agency Name: [facility]) Address: [address] [place], [place] [address] Phone: [phone] Service(s) Offered: Food is served to shut-ins, homeless, elderly, and low income people in the community every Saturday (11:30 am-12:30 pm) and Sunday (12:30 pm-1:30pm). Volunteers also offer help and encouragement in seeking employment,  and spiritual guidance.  Agency Name: [facility] Services Address: [address] [place], [place] [address] Phone: [phone] Service(s) Offered: Child support services; child welfare services; food stamps; Medicaid; work first family assistance; and aid  with fuel,  rent, food and medicine.  Agency Name: [facility] Address: [address], [place], [place] Phone: [phone] Website: [email or web address] Services Offered: Monday 10:00am-12:00, 8:00pm-9:00pm, and Friday 10:00am-12:00.  Agency Name: [employer] [facility] Address: [address] [place], [place] [address] Phone: [phone] Website: www.[email or web address].org Service(s) Offered: Serves weekday meals, open from 11:30 am- 1:00 pm., and 6:30-7:30pm, Monday-Wednesday-Friday distributes food 3:30-6pm, Monday-Wednesday-Friday.  Agency Name: [facility] Address: [address] [place], [place] Phone: [phone] Website: [email or web address] Services Offered: Distributes food the 4th Saturday of the month, starting at 8:00 am  Agency Name: [facility] Address: [phone] [address] [place], [place] [address] Phone: [phone] Website: [email or web address] Service(s) Offered: Bread of life, weekly food pantry. Open Wednesdays from 10:00am-noon.  Agency Name: [facility] [employer] Address: [address] [place], [place], [place] Phone: [phone] Services Offered: Distributes food 9am-1pm, Monday-Thursday. Call for details.  Agency Name: [facility] Address: [address], [place], [place] [address] Phone: [phone] Website: [email or web address] Service(s) Offered: Games developer. Call for assistance.  Agency Name: [facility] Address: [address] [place], [place] [address] Phone: [phone] Service Offered: Emergency Food Pantry. Call for appointment.  Agency Name: [facility] Address: [address] [place], [place] [address] Phone: [phone] Website: [email or web address] Services Offered: Games developer. Call for details  Agency Name: [facility] Address: [address] [place], [place] Phone:  [phone] Website: [email or web address] Service(s) Offered: Emergency Food Pantry. Call for details.  Agency Name: Holiday representative Address: [address], [place], [place] [address] Phone: [phone] or [phone] Website: [email or web address] Service(s) Offered: Distribute food 9am-11:30 am, Tuesday-Friday, and 1-3:30pm, Monday-Friday. Food pantry Monday-Friday 1pm-3pm, fresh items, Mon.-Wed.-Fri.  Agency Name: [facility] Address: [address], [place] [address] Phone: [phone] Website: www.safealamance.org Services Offered: Distribute food Tues and Sats from 9:00am-noon.  Closed 1st Saturday of each month. Call for details   Rent/Utility/Housing  Agency Name: [facility] Agency Address: [address] [place], [place] [address] Phone: [phone] Email: [email or web address] Website: [email or web address] Service(s) Offered: Housing services, self-sufficiency, congregate meal program, weatherization program, Field seismologist program, emergency food assistance,  housing counseling, home ownership program, wheels -towork program.  Agency Name: [facility] Address: [address] [place], [place] [address] Phone: [phone] (8a-4p) [phone] (8p- 10p) Email: [email or web address] Website: [email or web address] Service(s) Offered: A program for homeless and/or needy men that includes one-on-one counseling, life skills training and job rehabilitation.  Agency Name: [facility] [place] Address: [address], [place], [place] [address] Phone: [phone] Website: www.[email or web address].org Service(s) Offered: Assistance to needy in emergency with utility bills, heating fuel, and prescriptions. Shelter for homeless 7pm-7am. January 25, 2017 15  Agency Name: [facility] [place] [facility] Address: [address], [place], [place] [address] Phone: [phone] Contact Person: Cathleen Corti Email: [email or web address] Website: [email or web address] Service(s) Offered: Helps individuals with developmental disabilities move from housing that is more restrictive to homes where they  can achieve greater independence and have more  opportunities.  Agency Name: [facility] Address: [address], [place], [place] [address] Phone: [phone] Email: [email or web address] Website: www.[email or web address].org Service(s) Offered: Provides affordable housing for low-income families, elderly, and disabled individuals. Offer a wide range of  programs and services, from financial planning to afterschool and summer programs.  Agency Name: Department of Social Services Address: [address] [place], [place] [address] Phone: [phone] Service(s) Offered: Child support services; child welfare services; food stamps; Medicaid; work first family assistance; and aid with fuel,  rent, food and medicine.  Agency Name: [facility] [place] [employer]. Address: [facility] [address] [place], [place]  [address] Phone: [phone] Website: www.familyabuseservices.org Service(s) Offered: 24 hour Crisis Line: [phone]; 24 hour Emergency Shelter; Transitional Housing; Support Groups; Scientist, physiological; [employer]; Hispanic Outreach: [phone];  Visitation Center: [phone].  Agency Name: [facility] [place]. Address: [address], [place], [place] [address] Phone: [phone] Service(s) Offered: CAP Services; Home and [employer]; Individual or Group Supports; Respite Care Non-Institutional Nursing;  Residential Supports; Respite Care and Personal Care Services; Transportation; Family and Friends Night; Recreational Activities; Three Nutritious Meals/Snacks; Consultation with Registered Dietician; Twenty-four hour Registered Nurse  Access; Daily and [employer]; Camp Green Leaves; [place] for the [employer] (During Summer Months) Bingo Night (Every  Wednesday Night); Special Populations Dance Night  (Every Tuesday Night); Professional Hair Care Services.  Agency Name: [facility] Address: [address], [place], [place] [address] Phone: [phone] Contact Person: Jabier Mutton Website: [email or web address].Physicist, medical) Offered: Residential treatment facility for women; food and  clothing, educational & employment development and  transportation to work; Counsellor of financial skills;  parenting and family reunification; emotional and spiritual  support; transitional housing for program graduates.  Agency Name: [facility] Address: [address], [place], [place] [address] Phone: [phone] Email: [email or web address] Website: [email or web address].es Service(s) Offered: Public housing units for elderly, disabled, and low income people; housing choice vouchers for income eligible  applicants; shelter plus care vouchers; and Psychologist, clinical.  Agency Name: [facility] [employer] Address: [address], [place], [place] [address] Phone: [phone] Email: [email or web address] Website: www.habitatalamance.org Service(s) Offered: Build houses for families in need of decent housing. Each adult in the family must invest 200 hours of labor on  someone else's  house, work with volunteers to build their own house, attend classes on budgeting, home maintenance, yard care, and attend homeowner association meetings.  Agency Name: [facility] Address: [address] [place], [place] [address] Phone: [phone] Website: www.rsli.org Service(s) Offered: Intermediate care facilities for intellectually delayed, Supervised Living in group homes for adults with developmental disabilities, Supervised Living for  people who have dual diagnoses (MRMI), Independent Living, Supported Living, respite and a variety of CAP services, pre-vocational services, day supports, and [employer].  Agency Name: [place] [facility] Phone: [phone] Website: www.NCForeclosurePrevention.gov Service(s) Offered: Zero-interest, deferred loans to homeowners struggling to pay their mortgage. Call for more information.    Agency Name: [facility] Agency Address: [address], [place], [place] [address] Phone: [phone] Website: www.alamanceservices.org Service(s) Offered: Housing services, self-sufficiency, congregate meal program, and individual development account program.  Agency Name: [facility] [place] Address: [address] [place], [place] [address] Phone: [phone] Email: [email or web address] Website: www.alliedchurches.org Service(s) Offered: Housing the homeless, feeding the hungry, Company secretary, job and education related services.  Agency Name: [facility] Address: [address] [place], [place] [address] Phone: [phone] Email: [email or web address] Service(s) Offered: Counseling, problem pregnancy, advocacy for Hispanics, limited emergency financial assistance.  Agency Name: Department of Social Services Address: [address] [place], [place] [address] Phone: [phone] Website: [email or web address]/[email or web address] Service(s) Offered: Child support services; child welfare services; SNAP; Medicaid; work first family assistance; and aid with fuel,  rent, food and medicine.  Agency Name: Holiday representative Address: [address] [place], [place] [address] Phone: [phone] or [phone] Email: [email or web address] Service(s) Offered: Family services and transient assistance; emergency food, fuel, clothing, limited furniture, utilities; budget counseling, general counseling;  give a kid a coat; thrift store; Christmas food and toys. Utility assistance, food pantry, rental  assistance, life sustaining medicine
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
## 2023-04-05 NOTE — Progress Notes (Signed)
  PROGRESS NOTE    Anthony Levine  ZOX:[id number] DOB: 1976-09-12 DOA: 04/04/2023 [facility]: [facility]  209A/209A-AA  LOS: 0 days   Brief hospital course:   Assessment & Plan: Anthony Levine is a 47 y.o. male with medical history significant for No significant medical history who presents to the ED with a several hour history of multiple nonbloody nonbilious emesis episodes associated with right-sided flank pain radiating to the back.    Hydronephrosis with urinary obstruction due to proximal right ureteral calculus CT showing subtle right hydronephrosis secondary to a proximal right ureteric 5x7 mm stone No evidence of infection at this time, with reassuring urinalysis --urology consulted Plan: --cont pain management --KUB tomorrow morning, per urology  --cont MIVF --cont Flomax   DVT prophylaxis: Lovenox SQ Code Status: Full code  Family Communication:  Level of care: Med-Surg Dispo:   The patient is from: home Anticipated d/c is to: home Anticipated d/c date is: possible tomorrow   Subjective and Interval History:  Pt reported flank pain controlled by morphine.  Still hasn't passed the stone.   Objective: Vitals:   04/04/23 2141 04/05/23 0525 04/05/23 0854 04/05/23 1502  BP: 122/83 137/74 121/85 139/86  Pulse: (!) 55 (!) 50 (!) 50 (!) 52  Resp: 18 18 16 18   Temp: 98.4 F (36.9 C) 98.7 F (37.1 C) 98.7 F (37.1 C) 98.4 F (36.9 C)  TempSrc: Oral Oral    SpO2: 100% 99% 96% 100%  Weight:      Height:        Intake/Output Summary (Last 24 hours) at 04/05/2023 1911 Last data filed at 04/05/2023 1631 Gross per 24 hour  Intake 1952.84 ml  Output --  Net 1952.84 ml   Filed Weights   04/04/23 1548  Weight: 81.6 kg    Examination:   Constitutional: NAD, AAOx3 HEENT: conjunctivae and lids normal, EOMI CV: No cyanosis.   RESP: normal respiratory effort, on RA Neuro: II - XII grossly intact.   Psych: Normal mood and  affect.  Appropriate judgement and reason   Data Reviewed: I have personally reviewed labs and imaging studies  Time spent: 35 minutes  Darlin Priestly, MD [facility] If 7PM-7AM, please contact night-coverage 04/05/2023, 7:11 PM
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
## 2023-04-05 NOTE — Plan of Care (Signed)
Pt alert and oriented x 4. Up adlib. Diet currenlty NPO. LR infusing. Pt has received 2 doses of morphine since admitted to unit for right flank pain. Morphine has been effective. Strained urine this am. No signs of stone. Pt states he doesn't feel he has passed it at this time.  Problem: Education: Goal: Knowledge of General Education information will improve Description: Including pain rating scale, medication(s)/side effects and non-pharmacologic comfort measures Outcome: Progressing   Problem: Health Behavior/Discharge Planning: Goal: Ability to manage health-related needs will improve Outcome: Progressing   Problem: Clinical Measurements: Goal: Ability to maintain clinical measurements within normal limits will improve Outcome: Progressing Goal: Will remain free from infection Outcome: Progressing Goal: Diagnostic test results will improve Outcome: Progressing Goal: Respiratory complications will improve Outcome: Progressing Goal: Cardiovascular complication will be avoided Outcome: Progressing   Problem: Activity: Goal: Risk for activity intolerance will decrease Outcome: Progressing   Problem: Nutrition: Goal: Adequate nutrition will be maintained Outcome: Progressing   Problem: Coping: Goal: Level of anxiety will decrease Outcome: Progressing   Problem: Elimination: Goal: Will not experience complications related to bowel motility Outcome: Progressing Goal: Will not experience complications related to urinary retention Outcome: Progressing   Problem: Pain Managment: Goal: General experience of comfort will improve Outcome: Progressing   Problem: Safety: Goal: Ability to remain free from injury will improve Outcome: Progressing   Problem: Skin Integrity: Goal: Risk for impaired skin integrity will decrease Outcome: Progressing

## 2023-04-06 ENCOUNTER — Other Ambulatory Visit (HOSPITAL_COMMUNITY): Payer: Self-pay

## 2023-04-06 ENCOUNTER — Inpatient Hospital Stay: Payer: MEDICAID

## 2023-04-06 ENCOUNTER — Telehealth: Payer: Self-pay | Admitting: Urology

## 2023-04-06 ENCOUNTER — Other Ambulatory Visit: Payer: Self-pay

## 2023-04-06 ENCOUNTER — Emergency Department
Admission: EM | Admit: 2023-04-06 | Discharge: 2023-04-06 | Disposition: A | Discharge status: Home / Self Care | Payer: MEDICAID | Attending: Emergency Medicine | Admitting: Emergency Medicine

## 2023-04-06 DIAGNOSIS — N201 Calculus of ureter: Secondary | ICD-10-CM

## 2023-04-06 DIAGNOSIS — R109 Unspecified abdominal pain: Secondary | ICD-10-CM | POA: Diagnosis present

## 2023-04-06 DIAGNOSIS — N200 Calculus of kidney: Secondary | ICD-10-CM | POA: Insufficient documentation

## 2023-04-06 DIAGNOSIS — N1330 Unspecified hydronephrosis: Secondary | ICD-10-CM

## 2023-04-06 LAB — CBC
HCT: 33.6 % — ABNORMAL LOW (ref 39.0–52.0)
Hemoglobin: 11.5 g/dL — ABNORMAL LOW (ref 13.0–17.0)
MCH: 28.8 pg (ref 26.0–34.0)
MCHC: 34.2 g/dL (ref 30.0–36.0)
MCV: 84.2 fL (ref 80.0–100.0)
Platelets: 200 10*3/uL (ref 150–400)
RBC: 3.99 MIL/uL — ABNORMAL LOW (ref 4.22–5.81)
RDW: 16.4 % — ABNORMAL HIGH (ref 11.5–15.5)
WBC: 6.2 10*3/uL (ref 4.0–10.5)
nRBC: 0 % (ref 0.0–0.2)

## 2023-04-06 LAB — URINALYSIS, ROUTINE W REFLEX MICROSCOPIC
Bilirubin Urine: NEGATIVE
Glucose, UA: NEGATIVE mg/dL
Ketones, ur: NEGATIVE mg/dL
Leukocytes,Ua: NEGATIVE
Nitrite: NEGATIVE
Protein, ur: NEGATIVE mg/dL
Specific Gravity, Urine: 1.011 (ref 1.005–1.030)
Squamous Epithelial / HPF: NONE SEEN /HPF (ref 0–5)
pH: 5 (ref 5.0–8.0)

## 2023-04-06 LAB — BASIC METABOLIC PANEL
Anion gap: 8 (ref 5–15)
BUN: 13 mg/dL (ref 6–20)
CO2: 26 mmol/L (ref 22–32)
Calcium: 8.5 mg/dL — ABNORMAL LOW (ref 8.9–10.3)
Chloride: 105 mmol/L (ref 98–111)
Creatinine, Ser: 1.13 mg/dL (ref 0.61–1.24)
GFR, Estimated: >60 mL/min (ref >60)
Glucose, Bld: 84 mg/dL (ref 70–99)
Potassium: 3.7 mmol/L (ref 3.5–5.1)
Sodium: 139 mmol/L (ref 135–145)

## 2023-04-06 LAB — MAGNESIUM: Magnesium: 1.8 mg/dL (ref 1.7–2.4)

## 2023-04-06 MED ORDER — OXYCODONE HCL 5 MG PO TABS: 5.0000 mg | ORAL_TABLET | Freq: Four times a day (QID) | ORAL | 0 refills | Status: DC | PRN

## 2023-04-06 MED ORDER — TAMSULOSIN HCL 0.4 MG PO CAPS
0.4000 mg | ORAL_CAPSULE | Freq: Every day | ORAL | 0 refills | Status: AC
  Filled 2023-04-06: qty 7, 7d supply, fill #0

## 2023-04-06 MED ORDER — TAMSULOSIN HCL 0.4 MG PO CAPS: 0.4000 mg | ORAL_CAPSULE | Freq: Every day | ORAL | 0 refills | Status: DC

## 2023-04-06 MED ORDER — ONDANSETRON 4 MG PO TBDP
4.0000 mg | ORAL_TABLET | Freq: Three times a day (TID) | ORAL | 0 refills | Status: AC | PRN
  Filled 2023-04-06: qty 20, 7d supply, fill #0

## 2023-04-06 MED ORDER — KETOROLAC TROMETHAMINE 30 MG/ML IJ SOLN
30.0000 mg | Freq: Once | INTRAMUSCULAR | Status: AC
  Administered 2023-04-06: 30 mg via INTRAMUSCULAR
  Filled 2023-04-06: qty 1

## 2023-04-06 MED ORDER — OXYCODONE HCL 5 MG PO TABS
5.0000 mg | ORAL_TABLET | Freq: Four times a day (QID) | ORAL | 0 refills | Status: AC | PRN
  Filled 2023-04-06: qty 15, 4d supply, fill #0

## 2023-04-06 MED ORDER — NAPROXEN 500 MG PO TABS
500.0000 mg | ORAL_TABLET | Freq: Two times a day (BID) | ORAL | 2 refills | Status: AC
  Filled 2023-04-06: qty 20, 10d supply, fill #0

## 2023-04-06 NOTE — ED Provider Notes (Addendum)
[facility] Provider Note    Event Date/Time   First MD Initiated Contact with Patient 04/06/23 1454     (approximate)   History   Flank Pain   HPI  Anthony Levine is a 47 y.o. male who just left the hospital this morning after being treated for kidney stone pain.  Is unclear if he was discharged or left AMA.  Diagnosed yesterday with kidney stone, he reports pain is improved from when he was in the emergency department yesterday but he still continues to have discomfort.     Physical Exam   Triage Vital Signs: ED Triage Vitals  Enc Vitals Group     BP 04/06/23 1404 (!) 167/112     Pulse Rate 04/06/23 1405 (!) 108     Resp 04/06/23 1404 18     Temp 04/06/23 1404 98.4 F (36.9 C)     Temp src --      SpO2 04/06/23 1405 97 %     Weight 04/06/23 1405 81.6 kg (180 lb)     Height 04/06/23 1405 1.803 m (5\' 11" )     Head Circumference --      Peak Flow --      Pain Score 04/06/23 1404 6     Pain Loc --      Pain Edu? --      Excl. in GC? --     Most recent vital signs: Vitals:   04/06/23 1405 04/06/23 1500  BP:  (!) 161/101  Pulse: (!) 108 77  Resp:  17  Temp:    SpO2: 97% 100%     General: Awake, no distress.  CV:  Good peripheral perfusion.  Resp:  Normal effort.  Abd:  No distention.  Other:     ED Results / Procedures / Treatments   Labs (all labs ordered are listed, but only abnormal results are displayed) Labs Reviewed  URINALYSIS, ROUTINE W REFLEX MICROSCOPIC - Abnormal; Notable for the following components:      Result Value   Color, Urine YELLOW (*)    APPearance CLEAR (*)    Hgb urine dipstick MODERATE (*)    Bacteria, UA RARE (*)    All other components within normal limits     EKG    RADIOLOGY     PROCEDURES:  Critical Care performed:   Procedures   MEDICATIONS ORDERED IN ED: Medications  ketorolac (TORADOL) 30 MG/ML injection 30 mg (30 mg Intramuscular Given 04/06/23 1504)      IMPRESSION / MDM / ASSESSMENT AND PLAN / ED COURSE  I reviewed the triage vital signs and the nursing notes. Patient's presentation is most consistent with acute illness / injury with system symptoms.  Patient with known kidney stone, afebrile generally well-appearing today.  Pain better than yesterday.  Treated with IM Toradol.  Discussed with urology team, no indication for procedure emergently  Appropriate for discharge with outpatient follow-up.  Patient had some difficulty getting his medications at his pharmacy, have sent them to our [place] pharmacy to see if this is easier for him  Upon discharge patient aggressive and angry, it appears that he thinks that we should be able to remove the kidney stone immediately and I explained that was not indicated nor appropriate      FINAL CLINICAL IMPRESSION(S) / ED DIAGNOSES   Final diagnoses:  Kidney stone     Rx / DC Orders   ED Discharge Orders  Ordered    oxyCODONE (OXY IR/ROXICODONE) 5 MG immediate release tablet  Every 6 hours PRN        04/06/23 1520    tamsulosin (FLOMAX) 0.4 MG CAPS capsule  Daily        04/06/23 1520    naproxen (NAPROSYN) 500 MG tablet  2 times daily with meals        04/06/23 1520    ondansetron (ZOFRAN-ODT) 4 MG disintegrating tablet  Every 8 hours PRN        04/06/23 1520             Note:  This document was prepared using Dragon voice recognition software and may include unintentional dictation errors.   Jene Every, MD 04/06/23 1558    Jene Every, MD 04/06/23 [phone]

## 2023-04-06 NOTE — Discharge Summary (Signed)
Physician Discharge Summary   Anthony Levine  male DOB: [birth year]  ZOX:[id number]  PCP: [facility]  Admit date: 04/04/2023 Discharge date: 04/06/2023  Admitted From: home Disposition:  home CODE STATUS: Full code   Hospital Course:  For full details, please see H&P, progress notes, consult notes and ancillary notes.  Briefly,  Anthony Levine is a 46 y.o. male with no significant medical history who presented to the ED with several hour history of multiple nonbloody nonbilious emesis episodes associated with right-sided flank pain radiating to the back.    Mild right-sided hydronephrosis 2/2 proximal right ureteric stone CT showing subtle right hydronephrosis secondary to a proximal right ureteric 5x7 mm stone No evidence of infection at this time, with reassuring urinalysis.  No AKI. --urology consulted.   --Pt received MIVF, flomax and pain management.   --pt discharged on oxycodone and will need outpatient urology f/u for further stone management if stone doesn't pass on its own.   Discharge Diagnoses:  Principal Problem:   Nephrolithiasis Active Problems:   Hydronephrosis with urinary obstruction due to proximal right ureteral calculus   30 Day Unplanned Readmission Risk Score    Flowsheet Row ED to Hosp-Admission (Current) from 04/04/2023 in [facility]  30 Day Unplanned Readmission Risk Score (%) 6.4 Filed at 04/06/2023 0801       This score is the patient's risk of an unplanned readmission within 30 days of being discharged (0 -100%). The score is based on dignosis, age, lab data, medications, orders, and past utilization.   Low:  0-14.9   Medium: 15-21.9   High: 22-29.9   Extreme: 30 and above         Discharge Instructions:  Allergies as of 04/06/2023       Reactions   Tylenol [acetaminophen] Nausea And Vomiting        Medication List     TAKE these medications     clobetasol 0.05 % external solution Commonly known as: TEMOVATE Apply 1 Application topically 2 (two) times daily as needed.   hydrocortisone 2.5 % ointment Apply 1 Application topically 2 (two) times daily.   ketoconazole 2 % shampoo Commonly known as: NIZORAL Apply 1 Application topically 2 (two) times a week.   oxyCODONE 5 MG immediate release tablet Commonly known as: Oxy IR/ROXICODONE Take 1 tablet (5 mg total) by mouth every 6 (six) hours as needed for up to 3 days for moderate pain or severe pain.   tamsulosin 0.4 MG Caps capsule Commonly known as: FLOMAX Take 1 capsule (0.4 mg total) by mouth daily after supper.         Follow-up Information     [facility] Follow up in 1 week(s).   Specialty: General Practice Contact information: [address] [place] [address] [phone]                 Allergies  Allergen Reactions   Tylenol [Acetaminophen] Nausea And Vomiting     The results of significant diagnostics from this hospitalization (including imaging, microbiology, ancillary and laboratory) are listed below for reference.   Consultations:   Procedures/Studies: CT Renal Stone Study  Result Date: 04/04/2023 CLINICAL DATA:  Emesis.  Right-sided flank pain. EXAM: CT ABDOMEN AND PELVIS WITHOUT CONTRAST TECHNIQUE: Multidetector CT imaging of the abdomen and pelvis was performed following the standard protocol without IV contrast. RADIATION DOSE REDUCTION: This exam was performed according to the departmental dose-optimization  program which includes automated exposure control, adjustment of the mA and/or kV according to patient size and/or use of iterative reconstruction technique. COMPARISON:  12/13/2019 FINDINGS: Lower chest: Clear lung bases. Borderline cardiomegaly, without pericardial or pleural effusion. Hepatobiliary: Normal liver. Normal gallbladder, without biliary ductal dilatation. Pancreas: Normal,  without mass or ductal dilatation. Spleen: Normal in size, without focal abnormality. Adrenals/Urinary Tract: Normal adrenal glands. No renal calculi. Subtle right hydronephrosis secondary to a proximal right ureteric 5x7 mm stone including on 36/2. No bladder calculi. Stomach/Bowel: Normal stomach, without wall thickening. Normal colon. Normal small bowel. Vascular/Lymphatic: Aortic atherosclerosis. No abdominopelvic adenopathy. Reproductive: Normal prostate. Other: No significant free fluid.  No free intraperitoneal air. Musculoskeletal: Lumbosacral spondylosis. IMPRESSION: 1. Mild right-sided hydronephrosis secondary to a proximal right ureteric stone. 2.  Aortic Atherosclerosis (ICD10-I70.0). Electronically Signed   By: Jeronimo Greaves M.D.   On: 04/04/2023 16:20      Labs: BNP (last 3 results) No results for input(s): "BNP" in the last 8760 hours. Basic Metabolic Panel: Recent Labs  Lab 04/04/23 1551 04/06/23 0447  NA 141 139  K 3.7 3.7  CL 102 105  CO2 25 26  GLUCOSE 136* 84  BUN 18 13  CREATININE 1.24 1.13  CALCIUM 10.0 8.5*  MG  --  1.8   Liver Function Tests: Recent Labs  Lab 04/04/23 1551  AST 29  ALT 15  ALKPHOS 71  BILITOT 0.7  PROT 7.9  ALBUMIN 4.5   Recent Labs  Lab 04/04/23 1551  LIPASE 29   No results for input(s): "AMMONIA" in the last 168 hours. CBC: Recent Labs  Lab 04/04/23 1551 04/05/23 0549 04/06/23 0447  WBC 14.1* 10.0 6.2  HGB 14.3 12.0* 11.5*  HCT 43.0 35.0* 33.6*  MCV 86.0 83.5 84.2  PLT 247 205 200   Cardiac Enzymes: No results for input(s): "CKTOTAL", "CKMB", "CKMBINDEX", "TROPONINI" in the last 168 hours. BNP: Invalid input(s): "POCBNP" CBG: No results for input(s): "GLUCAP" in the last 168 hours. D-Dimer No results for input(s): "DDIMER" in the last 72 hours. Hgb A1c No results for input(s): "HGBA1C" in the last 72 hours. Lipid Profile No results for input(s): "CHOL", "HDL", "LDLCALC", "TRIG", "CHOLHDL", "LDLDIRECT" in the last  72 hours. Thyroid function studies No results for input(s): "TSH", "T4TOTAL", "T3FREE", "THYROIDAB" in the last 72 hours.  Invalid input(s): "FREET3" Anemia work up No results for input(s): "VITAMINB12", "FOLATE", "FERRITIN", "TIBC", "IRON", "RETICCTPCT" in the last 72 hours. Urinalysis    Component Value Date/Time   COLORURINE YELLOW (A) 04/04/2023 1551   APPEARANCEUR HAZY (A) 04/04/2023 1551   APPEARANCEUR Clear 01/14/2013 1527   LABSPEC 1.020 04/04/2023 1551   LABSPEC 1.030 01/14/2013 1527   PHURINE 6.0 04/04/2023 1551   GLUCOSEU NEGATIVE 04/04/2023 1551   GLUCOSEU Negative 01/14/2013 1527   HGBUR MODERATE (A) 04/04/2023 1551   BILIRUBINUR NEGATIVE 04/04/2023 1551   BILIRUBINUR Negative 01/14/2013 1527   KETONESUR 20 (A) 04/04/2023 1551   PROTEINUR 100 (A) 04/04/2023 1551   NITRITE NEGATIVE 04/04/2023 1551   LEUKOCYTESUR NEGATIVE 04/04/2023 1551   LEUKOCYTESUR Negative 01/14/2013 1527   Sepsis Labs Recent Labs  Lab 04/04/23 1551 04/05/23 0549 04/06/23 0447  WBC 14.1* 10.0 6.2   Microbiology No results found for this or any previous visit (from the past 240 hour(s)).   Total time spend on discharging this patient, including the last patient exam, discussing the hospital stay, instructions for ongoing care as it relates to all pertinent caregivers, as well as preparing the medical discharge  records, prescriptions, and/or referrals as applicable, is 50 minutes.    Darlin Priestly, MD  [facility] 04/06/2023, 8:17 AM

## 2023-04-06 NOTE — Progress Notes (Signed)
Urology Consult Follow Up  Subjective: His pain has been well-controlled over the night.  He is wanting to go home as his grandmother has recently passed that he wants to be with his family.  Vital signs stable afebrile  Serum creatinine 1.13 down slightly from yesterday's 1.24.  WBC count down from 10.1 yesterday to 6.2 this morning.    Anti-infectives: Anti-infectives (From admission, onward)    None       Current Facility-Administered Medications  Medication Dose Route Frequency Provider Last Rate Last Admin   acetaminophen (TYLENOL) tablet 650 mg  650 mg Oral Q6H PRN Andris Baumann, MD       Or   acetaminophen (TYLENOL) suppository 650 mg  650 mg Rectal Q6H PRN Andris Baumann, MD       enoxaparin (LOVENOX) injection 40 mg  40 mg Subcutaneous Q24H Darlin Priestly, MD   40 mg at 04/05/23 2120   lactated ringers infusion   Intravenous Continuous Darlin Priestly, MD 125 mL/hr at 04/06/23 0534 Infusion Verify at 04/06/23 0534   morphine (PF) 2 MG/ML injection 2 mg  2 mg Intravenous Q2H PRN Andris Baumann, MD   2 mg at 04/05/23 0525   ondansetron (ZOFRAN) tablet 4 mg  4 mg Oral Q6H PRN Andris Baumann, MD       Or   ondansetron [facility]) injection 4 mg  4 mg Intravenous Q6H PRN Andris Baumann, MD       oxyCODONE (Oxy IR/ROXICODONE) immediate release tablet 5 mg  5 mg Oral Q4H PRN Darlin Priestly, MD   5 mg at 04/05/23 2120   tamsulosin (FLOMAX) capsule 0.4 mg  0.4 mg Oral QPC supper Andris Baumann, MD   0.4 mg at 04/05/23 1725     Objective: Vital signs in last 24 hours: Temp:  [98.3 F (36.8 C)-98.7 F (37.1 C)] 98.3 F (36.8 C) (07/05 0503) Pulse Rate:  [50-52] 51 (07/05 0503) Resp:  [16-20] 20 (07/05 0503) BP: (121-152)/(85-89) 144/89 (07/05 0503) SpO2:  [96 %-100 %] 100 % (07/05 0503)  Intake/Output from previous day: 07/04 0701 - 07/05 0700 In: 3064.9 [I.V.:3064.9] Out: -  Intake/Output this shift: No intake/output data recorded.   Physical Exam Constitutional:       General: He is not in acute distress.    Appearance: He is well-developed and normal weight. He is not ill-appearing, toxic-appearing or diaphoretic.  HENT:     Head: Normocephalic and atraumatic.     Mouth/Throat:     Mouth: Mucous membranes are moist.     Pharynx: Oropharynx is clear.  Eyes:     Extraocular Movements: Extraocular movements intact.     Pupils: Pupils are equal, round, and reactive to light.  Pulmonary:     Effort: Pulmonary effort is normal.  Skin:    General: Skin is warm and dry.  Neurological:     General: No focal deficit present.     Mental Status: He is alert and oriented to person, place, and time.  Psychiatric:        Mood and Affect: Mood is anxious.     Lab Results:  Recent Labs    04/05/23 0549 04/06/23 0447  WBC 10.0 6.2  HGB 12.0* 11.5*  HCT 35.0* 33.6*  PLT 205 200   BMET Recent Labs    04/04/23 1551 04/06/23 0447  NA 141 139  K 3.7 3.7  CL 102 105  CO2 25 26  GLUCOSE 136* 84  BUN 18 13  CREATININE 1.24 1.13  CALCIUM 10.0 8.5*   PT/INR No results for input(s): "LABPROT", "INR" in the last 72 hours. ABG No results for input(s): "PHART", "HCO3" in the last 72 hours.  Invalid input(s): "PCO2", "PO2"  Studies/Results: CT Renal Stone Study  Result Date: 04/04/2023 CLINICAL DATA:  Emesis.  Right-sided flank pain. EXAM: CT ABDOMEN AND PELVIS WITHOUT CONTRAST TECHNIQUE: Multidetector CT imaging of the abdomen and pelvis was performed following the standard protocol without IV contrast. RADIATION DOSE REDUCTION: This exam was performed according to the departmental dose-optimization program which includes automated exposure control, adjustment of the mA and/or kV according to patient size and/or use of iterative reconstruction technique. COMPARISON:  12/13/2019 FINDINGS: Lower chest: Clear lung bases. Borderline cardiomegaly, without pericardial or pleural effusion. Hepatobiliary: Normal liver. Normal gallbladder, without biliary ductal  dilatation. Pancreas: Normal, without mass or ductal dilatation. Spleen: Normal in size, without focal abnormality. Adrenals/Urinary Tract: Normal adrenal glands. No renal calculi. Subtle right hydronephrosis secondary to a proximal right ureteric 5x7 mm stone including on 36/2. No bladder calculi. Stomach/Bowel: Normal stomach, without wall thickening. Normal colon. Normal small bowel. Vascular/Lymphatic: Aortic atherosclerosis. No abdominopelvic adenopathy. Reproductive: Normal prostate. Other: No significant free fluid.  No free intraperitoneal air. Musculoskeletal: Lumbosacral spondylosis. IMPRESSION: 1. Mild right-sided hydronephrosis secondary to a proximal right ureteric stone. 2.  Aortic Atherosclerosis (ICD10-I70.0). Electronically Signed   By: Jeronimo Greaves M.D.   On: 04/04/2023 16:20     Assessment: 47 year old male with a prior history of nephrolithiasis who presented to the ED yesterday for intractable pain secondary to a 7 mm right proximal stone who was admitted for pain control.  Skin to stone distance of 11.4 cm with a stone density of 909 Hounsfield units.    Stone is readily visible on KUB located just under the right L4 transverse process.     Plan: -Reviewed treatment options for his stones, but he would like to go home and do his own research regarding treatment options for stones -I explained to him that there is a very high probability that he will not be able to pass the stone on his own and if given the choice would he prefer ESWL or ureteroscopy, but he refused to make a decision -He did agree to return for an outpatient office visit with [place], so we will get that scheduled -Reviewed precautions reviewed -I emphasized the importance of outpatient follow-up to ensure that the stone does pass even if he is no longer having pain and stones that remain within the ureter will cause damage to his kidney over time and possibly set him up for sepsis -he voiced his  understanding -Would send him home with tamsulosin 0.4 mg daily to facilitate stone passage and with either a short course of  Vicodin 5/325 or Percocet 5/325 for pain control     LOS: 1 day    Trung Wenzl PA-C  04/06/2023

## 2023-04-06 NOTE — Telephone Encounter (Signed)
LMOM for pt to call office to schedule appt with any provider w/KUB prior to discuss kidney stones per [facility].

## 2023-04-06 NOTE — ED Triage Notes (Signed)
Pt to ED for continued kidney stones. States was admitted, left d/t grandmother passing. States needs meds to pass kidney stone, insurance wont cover . This RN attempted to get further information, pt get agitated and states "what did I just tell you"

## 2023-04-06 NOTE — Telephone Encounter (Signed)
Patient called back and was advised of the message. Patient states since it will be a week or 2 before he can get in he will go back to ER and hope to get medication for the pain and schedule a procedure for the stones while in the hospital. He is not able to wait a long time with the pain. His PCP office is closed and not able to help him.

## 2023-04-06 NOTE — Plan of Care (Signed)
Patient is adequate for discharge. Resolving plan of care.  Anthony Levine [name]  

## 2023-04-06 NOTE — ED Notes (Addendum)
Pt in NAD. Pt's skin dry, resp reg/unlabored and sitting calmly on stretcher. Pt states insurance wouldn't cover [facility] prescriptions [facility] and that he is in pain and wanted help to get pain under control. EDP Kinner at bedside. Pt stated "this is nothing like when I came in vomiting the other day".

## 2023-04-08 ENCOUNTER — Other Ambulatory Visit: Payer: Self-pay | Admitting: Internal Medicine

## 2023-04-11 ENCOUNTER — Other Ambulatory Visit: Payer: Self-pay

## 2023-04-11 ENCOUNTER — Encounter: Payer: Self-pay | Admitting: *Deleted

## 2023-04-11 DIAGNOSIS — R109 Unspecified abdominal pain: Secondary | ICD-10-CM | POA: Insufficient documentation

## 2023-04-11 DIAGNOSIS — R944 Abnormal results of kidney function studies: Secondary | ICD-10-CM | POA: Insufficient documentation

## 2023-04-11 LAB — CBC
HCT: 37.6 % — ABNORMAL LOW (ref 39.0–52.0)
Hemoglobin: 12.6 g/dL — ABNORMAL LOW (ref 13.0–17.0)
MCH: 28.3 pg (ref 26.0–34.0)
MCHC: 33.5 g/dL (ref 30.0–36.0)
MCV: 84.5 fL (ref 80.0–100.0)
Platelets: 237 10*3/uL (ref 150–400)
RBC: 4.45 MIL/uL (ref 4.22–5.81)
RDW: 16.7 % — ABNORMAL HIGH (ref 11.5–15.5)
WBC: 6.9 10*3/uL (ref 4.0–10.5)
nRBC: 0 % (ref 0.0–0.2)

## 2023-04-11 LAB — URINALYSIS, ROUTINE W REFLEX MICROSCOPIC
Bilirubin Urine: NEGATIVE
Glucose, UA: NEGATIVE mg/dL
Hgb urine dipstick: NEGATIVE
Ketones, ur: NEGATIVE mg/dL
Leukocytes,Ua: NEGATIVE
Nitrite: NEGATIVE
Protein, ur: NEGATIVE mg/dL
Specific Gravity, Urine: 1.016 (ref 1.005–1.030)
pH: 7 (ref 5.0–8.0)

## 2023-04-11 LAB — BASIC METABOLIC PANEL
Anion gap: 8 (ref 5–15)
BUN: 17 mg/dL (ref 6–20)
CO2: 28 mmol/L (ref 22–32)
Calcium: 9 mg/dL (ref 8.9–10.3)
Chloride: 106 mmol/L (ref 98–111)
Creatinine, Ser: 1.39 mg/dL — ABNORMAL HIGH (ref 0.61–1.24)
GFR, Estimated: >60 mL/min (ref >60)
Glucose, Bld: 119 mg/dL — ABNORMAL HIGH (ref 70–99)
Potassium: 3.8 mmol/L (ref 3.5–5.1)
Sodium: 142 mmol/L (ref 135–145)

## 2023-04-11 NOTE — ED Triage Notes (Signed)
Pt reports lower back pain and lower abd pain.  Pt reports hx kidney stone.  No n/v/  pt alert  speech clear.

## 2023-04-12 ENCOUNTER — Emergency Department
Admission: EM | Admit: 2023-04-12 | Discharge: 2023-04-12 | Disposition: A | Discharge status: Home / Self Care | Payer: MEDICAID | Attending: Emergency Medicine | Admitting: Emergency Medicine

## 2023-04-12 DIAGNOSIS — R109 Unspecified abdominal pain: Secondary | ICD-10-CM

## 2023-04-12 NOTE — ED Provider Notes (Addendum)
[facility] Provider Note    Event Date/Time   First MD Initiated Contact with Patient 04/12/23 0248     (approximate)   History   Back Pain   HPI  Anthony Levine is a 47 y.o. male with no significant past medical history who presents with right flank pain.  The patient states that the pain has been ongoing in the same place.  However, when asked further questions about the pain and what he is taking for it, the patient appeared angry and confrontational and started raising his voice at me.  I reviewed the past medical records.  The patient presented on 7/3 with right flank pain.  He had a large ureteral stone and had intractable pain so was admitted to the hospitalist service.  He was discharged on 7/5.  He was subsequently seen in the ED for pain and was prescribed additional pain medication.   Physical Exam   Triage Vital Signs: ED Triage Vitals  Encounter Vitals Group     BP 04/11/23 2259 (!) 156/106     Systolic BP Percentile --      Diastolic BP Percentile --      Pulse Rate 04/11/23 2259 68     Resp 04/11/23 2259 18     Temp 04/11/23 2259 98 F (36.7 C)     Temp Source 04/11/23 2259 Oral     SpO2 04/11/23 2259 98 %     Weight 04/11/23 2257 175 lb (79.4 kg)     Height 04/11/23 2257 5\' 11"  (1.803 m)     Head Circumference --      Peak Flow --      Pain Score 04/11/23 2257 10     Pain Loc --      Pain Education --      Exclude from Growth Chart --     Most recent vital signs: Vitals:   04/11/23 2259  BP: (!) 156/106  Pulse: 68  Resp: 18  Temp: 98 F (36.7 C)  SpO2: 98%     General: Awake, comfortable appearing, no distress.  CV:  Good peripheral perfusion.  Resp:  Normal effort.  Abd:  No distention.  Other:  Normal gait.     ED Results / Procedures / Treatments   Labs (all labs ordered are listed, but only abnormal results are displayed) Labs Reviewed  URINALYSIS, ROUTINE W REFLEX MICROSCOPIC - Abnormal;  Notable for the following components:      Result Value   Color, Urine YELLOW (*)    APPearance CLEAR (*)    All other components within normal limits  BASIC METABOLIC PANEL - Abnormal; Notable for the following components:   Glucose, Bld 119 (*)    Creatinine, Ser 1.39 (*)    All other components within normal limits  CBC - Abnormal; Notable for the following components:   Hemoglobin 12.6 (*)    HCT 37.6 (*)    RDW 16.7 (*)    All other components within normal limits     EKG     RADIOLOGY    PROCEDURES:  Critical Care performed: No  Procedures   MEDICATIONS ORDERED IN ED: Medications - No data to display   IMPRESSION / MDM / ASSESSMENT AND PLAN / ED COURSE  I reviewed the triage vital signs and the nursing notes.  47 year old male with PMH as noted above presents with right flank pain.  Differential diagnosis includes, but is not limited to, ureteral stone.  Patient's  presentation is most consistent with acute complicated illness / injury requiring diagnostic workup.  As I started obtaining history from the patient, he was using his smart phone and did not look up at me.  When I asked him about whether he was taking the pain medicine engaged and that was prescribed and how often, he appeared angry, started raising his voice, and saying "I take it whenever I have the pain, that is what I am telling you" and started accusing me of asking the same question multiple times.  I informed the patient that I would not tolerate him raising his voice at me and would not continue the evaluation until he calm down and could speak to me in an appropriate manner.  I stated I would be happy to evaluate him and wanted to help him but would not do so if he was behaving in a confrontational manner.  I then stepped away back to the desk and the patient got up and walked out of the ED.  He appeared comfortable with normal gait.  Lab workup is unremarkable.  Creatinine is 1.39 which is  minimally elevated from prior.  There is no leukocytosis or anemia.  Urinalysis is negative.   FINAL CLINICAL IMPRESSION(S) / ED DIAGNOSES   Final diagnoses:  Flank pain     Rx / DC Orders   ED Discharge Orders     None        Note:  This document was prepared using Dragon voice recognition software and may include unintentional dictation errors.    Dionne Bucy, MD 04/12/23 1610    Dionne Bucy, MD 04/12/23 [phone]

## 2023-04-14 ENCOUNTER — Emergency Department: Payer: MEDICAID

## 2023-04-14 ENCOUNTER — Emergency Department
Admission: EM | Admit: 2023-04-14 | Discharge: 2023-04-14 | Disposition: A | Discharge status: Home / Self Care | Payer: MEDICAID | Attending: Emergency Medicine | Admitting: Emergency Medicine

## 2023-04-14 ENCOUNTER — Other Ambulatory Visit: Payer: Self-pay

## 2023-04-14 DIAGNOSIS — Z5900 Homelessness unspecified: Secondary | ICD-10-CM | POA: Insufficient documentation

## 2023-04-14 DIAGNOSIS — N200 Calculus of kidney: Secondary | ICD-10-CM | POA: Insufficient documentation

## 2023-04-14 DIAGNOSIS — R109 Unspecified abdominal pain: Secondary | ICD-10-CM | POA: Diagnosis present

## 2023-04-14 LAB — COMPREHENSIVE METABOLIC PANEL
ALT: 13 U/L (ref 0–44)
AST: 19 U/L (ref 15–41)
Albumin: 4 g/dL (ref 3.5–5.0)
Alkaline Phosphatase: 58 U/L (ref 38–126)
Anion gap: 8 (ref 5–15)
BUN: 20 mg/dL (ref 6–20)
CO2: 24 mmol/L (ref 22–32)
Calcium: 9.2 mg/dL (ref 8.9–10.3)
Chloride: 108 mmol/L (ref 98–111)
Creatinine, Ser: 1.32 mg/dL — ABNORMAL HIGH (ref 0.61–1.24)
GFR, Estimated: >60 mL/min (ref >60)
Glucose, Bld: 114 mg/dL — ABNORMAL HIGH (ref 70–99)
Potassium: 3.7 mmol/L (ref 3.5–5.1)
Sodium: 140 mmol/L (ref 135–145)
Total Bilirubin: 0.6 mg/dL (ref 0.3–1.2)
Total Protein: 6.8 g/dL (ref 6.5–8.1)

## 2023-04-14 LAB — CBC
HCT: 37.8 % — ABNORMAL LOW (ref 39.0–52.0)
Hemoglobin: 13.1 g/dL (ref 13.0–17.0)
MCH: 28.8 pg (ref 26.0–34.0)
MCHC: 34.7 g/dL (ref 30.0–36.0)
MCV: 83.1 fL (ref 80.0–100.0)
Platelets: 231 10*3/uL (ref 150–400)
RBC: 4.55 MIL/uL (ref 4.22–5.81)
RDW: 17.2 % — ABNORMAL HIGH (ref 11.5–15.5)
WBC: 11.5 10*3/uL — ABNORMAL HIGH (ref 4.0–10.5)
nRBC: 0 % (ref 0.0–0.2)

## 2023-04-14 LAB — URINALYSIS, ROUTINE W REFLEX MICROSCOPIC
Bilirubin Urine: NEGATIVE
Glucose, UA: NEGATIVE mg/dL
Hgb urine dipstick: NEGATIVE
Ketones, ur: NEGATIVE mg/dL
Leukocytes,Ua: NEGATIVE
Nitrite: NEGATIVE
Protein, ur: NEGATIVE mg/dL
Specific Gravity, Urine: 1.013 (ref 1.005–1.030)
pH: 7 (ref 5.0–8.0)

## 2023-04-14 LAB — LIPASE, BLOOD: Lipase: 51 U/L (ref 11–51)

## 2023-04-14 MED ORDER — KETOROLAC TROMETHAMINE 15 MG/ML IJ SOLN
15.0000 mg | Freq: Once | INTRAMUSCULAR | Status: AC
  Administered 2023-04-14: 15 mg via INTRAVENOUS
  Filled 2023-04-14: qty 1

## 2023-04-14 MED ORDER — KETOROLAC TROMETHAMINE 10 MG PO TABS: 10.0000 mg | ORAL_TABLET | Freq: Four times a day (QID) | ORAL | 0 refills | Status: DC | PRN

## 2023-04-14 MED ORDER — SODIUM CHLORIDE 0.9 % IV BOLUS
1000.0000 mL | Freq: Once | INTRAVENOUS | Status: AC
  Administered 2023-04-14: 1000 mL via INTRAVENOUS

## 2023-04-14 MED ORDER — DROPERIDOL 2.5 MG/ML IJ SOLN
2.0000 mg | Freq: Once | INTRAMUSCULAR | Status: AC
  Administered 2023-04-14: 2 mg via INTRAVENOUS
  Filled 2023-04-14: qty 2

## 2023-04-14 MED ORDER — ONDANSETRON HCL 4 MG/2ML IJ SOLN
4.0000 mg | Freq: Once | INTRAMUSCULAR | Status: AC
  Administered 2023-04-14: 4 mg via INTRAVENOUS
  Filled 2023-04-14: qty 2

## 2023-04-14 MED ORDER — MORPHINE SULFATE (PF) 4 MG/ML IV SOLN
4.0000 mg | INTRAVENOUS | Status: DC | PRN
  Administered 2023-04-14: 4 mg via INTRAVENOUS
  Filled 2023-04-14: qty 1

## 2023-04-14 MED ORDER — OXYCODONE HCL 5 MG PO TABS: 5.0000 mg | ORAL_TABLET | Freq: Four times a day (QID) | ORAL | 0 refills | Status: DC | PRN

## 2023-04-14 MED ORDER — METOCLOPRAMIDE HCL 10 MG PO TABS: 10.0000 mg | ORAL_TABLET | Freq: Three times a day (TID) | ORAL | 1 refills | Status: AC | PRN

## 2023-04-14 MED ORDER — KETOROLAC TROMETHAMINE 30 MG/ML IJ SOLN
30.0000 mg | Freq: Once | INTRAMUSCULAR | Status: AC
  Administered 2023-04-14: 30 mg via INTRAVENOUS
  Filled 2023-04-14: qty 1

## 2023-04-14 NOTE — ED Triage Notes (Signed)
Pt to ED via POV c/o right flank pain. Pt reports he has a kidney stone. Pain has continued. Pt endorses N/V. Denies CP, SOB

## 2023-04-14 NOTE — Discharge Instructions (Signed)
Call [facility] urological and let them know you had to come the emergency department again.  Follow-up for your appointment that you have scheduled.  Return if worsening

## 2023-04-14 NOTE — ED Provider Notes (Signed)
[facility] Provider Note    Event Date/Time   First MD Initiated Contact with Patient 04/14/23 [phone]     (approximate)   History   Flank Pain and Emesis   HPI  Anthony Levine is a 47 y.o. male with a history of kidney stones, blood clot in the abdomen presents emergency department with continued flank pain.  Patient was diagnosed with a kidney stone on 04/04/2023 and was admitted for pain control.  Patient continues to have flank pain.  States he did have some vomiting and diarrhea earlier today.  Did not follow-up with urology.  Patient states he feels like he has chills.  He is afebrile here in the ED      Physical Exam   Triage Vital Signs: ED Triage Vitals [04/14/23 0547]  Encounter Vitals Group     BP (!) 180/105     Systolic BP Percentile      Diastolic BP Percentile      Pulse Rate (!) 59     Resp 20     Temp 98.6 F (37 C)     Temp Source Oral     SpO2 96 %     Weight      Height      Head Circumference      Peak Flow      Pain Score 10     Pain Loc      Pain Education      Exclude from Growth Chart     Most recent vital signs: Vitals:   04/14/23 0547  BP: (!) 180/105  Pulse: (!) 59  Resp: 20  Temp: 98.6 F (37 C)  SpO2: 96%     General: Awake, no distress.   CV:  Good peripheral perfusion. regular rate and  rhythm Resp:  Normal effort. Lungs cta Abd:  No distention.  Nontender Other:      ED Results / Procedures / Treatments   Labs (all labs ordered are listed, but only abnormal results are displayed) Labs Reviewed  COMPREHENSIVE METABOLIC PANEL - Abnormal; Notable for the following components:      Result Value   Glucose, Bld 114 (*)    Creatinine, Ser 1.32 (*)    All other components within normal limits  CBC - Abnormal; Notable for the following components:   WBC 11.5 (*)    HCT 37.8 (*)    RDW 17.2 (*)    All other components within normal limits  URINALYSIS, ROUTINE W REFLEX MICROSCOPIC -  Abnormal; Notable for the following components:   Color, Urine YELLOW (*)    APPearance CLOUDY (*)    All other components within normal limits  LIPASE, BLOOD     EKG     RADIOLOGY Ct renal stone    PROCEDURES:   Procedures   MEDICATIONS ORDERED IN ED: Medications  morphine (PF) 4 MG/ML injection 4 mg (4 mg Intravenous Given 04/14/23 0852)  ondansetron (ZOFRAN) injection 4 mg (4 mg Intravenous Given 04/14/23 0629)  ketorolac (TORADOL) 15 MG/ML injection 15 mg (15 mg Intravenous Given 04/14/23 0629)  sodium chloride 0.9 % bolus 1,000 mL (0 mLs Intravenous Stopped 04/14/23 0928)  ketorolac (TORADOL) 30 MG/ML injection 30 mg (30 mg Intravenous Given 04/14/23 0726)  ondansetron (ZOFRAN) injection 4 mg (4 mg Intravenous Given 04/14/23 0726)  droperidol (INAPSINE) 2.5 MG/ML injection 2 mg (2 mg Intravenous Given 04/14/23 0836)  sodium chloride 0.9 % bolus 1,000 mL (0 mLs Intravenous Stopped 04/14/23 1029)  IMPRESSION / MDM / ASSESSMENT AND PLAN / ED COURSE  I reviewed the triage vital signs and the nursing notes.                              Differential diagnosis includes, but is not limited to, kidney stone, infected kidney, abscess  Patient's presentation is most consistent with acute presentation with potential threat to life or bodily function.   Patient's labs are reassuring  CT renal stone study continues to show a 5 mm kidney stone in the mid ureter.  No stranding to indicate infection  Patient was given normal saline 1 L IV, Toradol 30 mg IV, Arfeen 4 mg IV and Zofran 4 mg IV.  After the medications he continues to have nausea so he was given droperidol 2 mg IV after the EKG.  EKG showed bradycardia but no QT prolongation.  I did discuss all the findings with patient.  Basically wants to be admitted as he is homeless and living in his bands.  States he cannot deal with this while living in his [name].  Did explain to him I do not have criteria to admit him at this time  as his pain is under control.  Will give him a prescription for pain medication, nausea medication and Toradol.  He is to follow-up with urology.  Patient states he has an appointment on 17th.  Encouraged him to return if worsening.  Patient was given a second liter of fluid prior to discharge.  He was stable at time of discharge.      FINAL CLINICAL IMPRESSION(S) / ED DIAGNOSES   Final diagnoses:  Kidney stone  Homeless     Rx / DC Orders   ED Discharge Orders          Ordered    metoCLOPramide (REGLAN) 10 MG tablet  Every 8 hours PRN        04/14/23 0918    oxyCODONE (ROXICODONE) 5 MG immediate release tablet  Every 6 hours PRN        04/14/23 0918    ketorolac (TORADOL) 10 MG tablet  Every 6 hours PRN        04/14/23 1610             Note:  This document was prepared using Dragon voice recognition software and may include unintentional dictation errors.    Faythe Ghee, PA-C 04/14/23 1141    Willy Eddy, MD 04/14/23 1224

## 2023-04-14 NOTE — ED Notes (Signed)
Patient transported to CT 

## 2023-04-26 ENCOUNTER — Ambulatory Visit
Admission: RE | Admit: 2023-04-26 | Discharge: 2023-04-26 | Disposition: A | Discharge status: Home / Self Care | Payer: MEDICAID | Source: Ambulatory Visit | Attending: Urology | Admitting: Urology

## 2023-04-26 ENCOUNTER — Ambulatory Visit (INDEPENDENT_AMBULATORY_CARE_PROVIDER_SITE_OTHER): Payer: MEDICAID | Admitting: Urology

## 2023-04-26 ENCOUNTER — Encounter: Payer: Self-pay | Admitting: Urology

## 2023-04-26 VITALS — BP 130/74 | HR 70 | Ht 72.0 in | Wt 180.0 lb

## 2023-04-26 DIAGNOSIS — N200 Calculus of kidney: Secondary | ICD-10-CM | POA: Insufficient documentation

## 2023-04-26 DIAGNOSIS — N201 Calculus of ureter: Secondary | ICD-10-CM

## 2023-04-26 LAB — MICROSCOPIC EXAMINATION

## 2023-04-26 LAB — URINALYSIS, COMPLETE
Bilirubin, UA: NEGATIVE
Glucose, UA: NEGATIVE
Leukocytes,UA: NEGATIVE
Nitrite, UA: NEGATIVE
RBC, UA: NEGATIVE
Specific Gravity, UA: 1.025 (ref 1.005–1.030)
Urobilinogen, Ur: 0.2 mg/dL (ref 0.2–1.0)
pH, UA: 5.5 (ref 5.0–7.5)
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
## 2023-04-27 ENCOUNTER — Telehealth: Payer: Self-pay | Admitting: Urology

## 2023-04-27 DIAGNOSIS — N201 Calculus of ureter: Secondary | ICD-10-CM

## 2023-04-27 NOTE — Telephone Encounter (Signed)
KUB was reviewed and stone is still present but has progressed to the lower ureter.  If he wants to continue to try and pass recommend a follow-up KUB in 2 weeks.  Can also schedule ureteroscopy if he wants to have the stone removed.

## 2023-04-27 NOTE — Telephone Encounter (Signed)
Pt called to let you know he would like to schedule ureteroscopy to have the stone removed.

## 2023-04-29 NOTE — Addendum Note (Signed)
Addended by: Riki Altes on: 04/29/2023 12:52 PM   Modules accepted: Orders

## 2023-04-30 ENCOUNTER — Telehealth: Payer: Self-pay

## 2023-04-30 ENCOUNTER — Other Ambulatory Visit: Payer: Self-pay

## 2023-04-30 DIAGNOSIS — N201 Calculus of ureter: Secondary | ICD-10-CM

## 2023-04-30 NOTE — Telephone Encounter (Signed)
Tried calling patient to set up surgery. No Answer. Left Detailed voicemail to return my call.

## 2023-04-30 NOTE — Progress Notes (Signed)
ESWL ORDER FORM  Expected date of procedure: 05/03/2023  Surgeon: Legrand Rams, MD  Post op standing: 2-4wk follow up w/KUB prior  Anticoagulation/Aspirin/NSAID standing order: Hold all 72 hours prior  Anesthesia standing order: MAC  VTE standing: SCD's  Dx: Right Ureteral Stone  Procedure: right Extracorporeal shock wave lithotripsy  CPT : 50590  Standing Order Set:   *NPO after mn, KUB  *NS 117ml/hr, Keflex 500mg  PO, Benadryl 25mg  PO, Valium 10mg  PO, Zofran 4mg  IV    Medications if other than standing orders:   Keflex 500mg  PO

## 2023-05-10 ENCOUNTER — Encounter: Payer: Self-pay | Admitting: Urology

## 2023-05-10 ENCOUNTER — Ambulatory Visit
Admission: RE | Admit: 2023-05-10 | Discharge: 2023-05-10 | Disposition: A | Discharge status: Home / Self Care | Payer: Medicaid Other | Attending: Urology | Admitting: Urology

## 2023-05-10 ENCOUNTER — Other Ambulatory Visit: Payer: Self-pay

## 2023-05-10 ENCOUNTER — Ambulatory Visit: Payer: Medicaid Other

## 2023-05-10 ENCOUNTER — Encounter
Admission: RE | Disposition: A | Discharge status: Home / Self Care | Payer: Self-pay | Source: Home / Self Care | Attending: Urology

## 2023-05-10 DIAGNOSIS — N201 Calculus of ureter: Secondary | ICD-10-CM | POA: Insufficient documentation

## 2023-05-10 DIAGNOSIS — F17210 Nicotine dependence, cigarettes, uncomplicated: Secondary | ICD-10-CM | POA: Insufficient documentation

## 2023-05-10 HISTORY — PX: EXTRACORPOREAL SHOCK WAVE LITHOTRIPSY: SHX1557

## 2023-05-10 SURGERY — LITHOTRIPSY, ESWL
Anesthesia: Moderate Sedation | Laterality: Right

## 2023-05-10 MED ORDER — CEPHALEXIN 500 MG PO CAPS
500.0000 mg | ORAL_CAPSULE | Freq: Once | ORAL | Status: AC
  Administered 2023-05-10: 500 mg via ORAL

## 2023-05-10 MED ORDER — ONDANSETRON HCL 4 MG/2ML IJ SOLN
INTRAMUSCULAR | Status: AC
  Filled 2023-05-10: qty 2

## 2023-05-10 MED ORDER — HYDROCODONE-ACETAMINOPHEN 5-325 MG PO TABS: 1.0000 | ORAL_TABLET | Freq: Four times a day (QID) | ORAL | 0 refills | Status: DC | PRN

## 2023-05-10 MED ORDER — DIPHENHYDRAMINE HCL 25 MG PO CAPS
ORAL_CAPSULE | ORAL | Status: AC
  Filled 2023-05-10: qty 1

## 2023-05-10 MED ORDER — HYDROCODONE-ACETAMINOPHEN 5-325 MG PO TABS
1.0000 | ORAL_TABLET | Freq: Four times a day (QID) | ORAL | 0 refills | Status: DC | PRN
  Filled 2023-05-10: qty 10, 2d supply, fill #0

## 2023-05-10 MED ORDER — SODIUM CHLORIDE 0.9 % IV SOLN: INTRAVENOUS | Status: DC

## 2023-05-10 MED ORDER — ONDANSETRON HCL 4 MG/2ML IJ SOLN
4.0000 mg | Freq: Once | INTRAMUSCULAR | Status: AC
  Administered 2023-05-10: 4 mg via INTRAVENOUS

## 2023-05-10 MED ORDER — DIPHENHYDRAMINE HCL 25 MG PO CAPS
25.0000 mg | ORAL_CAPSULE | ORAL | Status: AC
  Administered 2023-05-10: 25 mg via ORAL

## 2023-05-10 MED ORDER — DIAZEPAM 5 MG PO TABS
10.0000 mg | ORAL_TABLET | ORAL | Status: AC
  Administered 2023-05-10: 10 mg via ORAL

## 2023-05-10 MED ORDER — CEPHALEXIN 500 MG PO CAPS
ORAL_CAPSULE | ORAL | Status: AC
  Filled 2023-05-10: qty 1

## 2023-05-10 MED ORDER — DIAZEPAM 5 MG PO TABS
ORAL_TABLET | ORAL | Status: AC
  Filled 2023-05-10: qty 2

## 2023-05-10 NOTE — Telephone Encounter (Signed)
Patient states [facility] does not have this dose of medication, can this be switched to [facility] pharmacy, medication pended for review

## 2023-05-10 NOTE — Telephone Encounter (Signed)
Patient advised.

## 2023-05-10 NOTE — Interval H&P Note (Signed)
History and Physical Interval Note:  05/10/2023 10:14 AM  [facility]  has presented today for surgery, with the diagnosis of Right Ureteral Stone.  The various methods of treatment have been discussed with the patient and family. After consideration of risks, benefits and other options for treatment, the patient has consented to  Procedure(s): EXTRACORPOREAL SHOCK WAVE LITHOTRIPSY (ESWL) (Right) as a surgical intervention.  The patient's history has been reviewed, patient examined, no change in status, stable for surgery.  I have reviewed the patient's chart and labs.  Questions were answered to the patient's satisfaction.    RRR CTAB  Vanna Scotland

## 2023-05-10 NOTE — Progress Notes (Signed)
Per Dr. Apolinar Junes:  procedure went as planned, follow up in two weeks with KUB, strain urine. Any questions/concerns please call office.  Patient and family questioning "how many stones?  Did they get all?" Reviewed procedure, discharge instructions, reinforced straining urine. Denies any discomfort upon discharge.

## 2023-05-10 NOTE — Discharge Instructions (Signed)
AMBULATORY SURGERY  DISCHARGE INSTRUCTIONS   The drugs that you were given will stay in your system until tomorrow so for the next 24 hours you should not:  Drive an automobile Make any legal decisions Drink any alcoholic beverage   You may resume regular meals tomorrow.  Today it is better to start with liquids and gradually work up to solid foods.  You may eat anything you prefer, but it is better to start with liquids, then soup and crackers, and gradually work up to solid foods.   Please notify your doctor immediately if you have any unusual bleeding, trouble breathing, redness and pain at the surgery site, drainage, fever, or pain not relieved by medication.       Please contact your physician with any problems or Same Day Surgery at [phone], Monday through Friday 6 am to 4 pm, or [place] [facility] [place] Main number at [phone].  

## 2023-05-14 ENCOUNTER — Other Ambulatory Visit: Payer: Self-pay

## 2023-05-14 DIAGNOSIS — N201 Calculus of ureter: Secondary | ICD-10-CM

## 2023-05-16 ENCOUNTER — Ambulatory Visit: Payer: Medicaid Other | Admitting: Urology

## 2023-05-18 ENCOUNTER — Other Ambulatory Visit: Payer: Self-pay
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
## 2023-05-30 ENCOUNTER — Ambulatory Visit (INDEPENDENT_AMBULATORY_CARE_PROVIDER_SITE_OTHER): Payer: Medicaid Other | Admitting: Urology

## 2023-05-30 ENCOUNTER — Encounter: Payer: Self-pay | Admitting: Urology

## 2023-05-30 ENCOUNTER — Ambulatory Visit
Admission: RE | Admit: 2023-05-30 | Discharge: 2023-05-30 | Disposition: A | Discharge status: Home / Self Care | Payer: Medicaid Other | Attending: Urology | Admitting: Urology

## 2023-05-30 ENCOUNTER — Ambulatory Visit: Admission: RE | Admit: 2023-05-30 | Payer: Medicaid Other | Source: Ambulatory Visit | Admitting: Urology

## 2023-05-30 ENCOUNTER — Other Ambulatory Visit: Payer: Self-pay

## 2023-05-30 VITALS — BP 143/87 | HR 60 | Ht 72.0 in | Wt 180.0 lb

## 2023-05-30 DIAGNOSIS — N201 Calculus of ureter: Secondary | ICD-10-CM

## 2023-06-07 ENCOUNTER — Encounter
Admission: RE | Disposition: A | Discharge status: Home / Self Care | Payer: Self-pay | Source: Home / Self Care | Attending: Urology

## 2023-06-07 ENCOUNTER — Other Ambulatory Visit: Payer: Self-pay

## 2023-06-07 ENCOUNTER — Encounter: Payer: Self-pay | Admitting: Urology

## 2023-06-07 ENCOUNTER — Ambulatory Visit: Payer: Medicaid Other

## 2023-06-07 ENCOUNTER — Ambulatory Visit
Admission: RE | Admit: 2023-06-07 | Discharge: 2023-06-07 | Disposition: A | Discharge status: Home / Self Care | Payer: Medicaid Other | Attending: Urology | Admitting: Urology

## 2023-06-07 DIAGNOSIS — N201 Calculus of ureter: Secondary | ICD-10-CM | POA: Insufficient documentation

## 2023-06-07 DIAGNOSIS — F17210 Nicotine dependence, cigarettes, uncomplicated: Secondary | ICD-10-CM | POA: Diagnosis not present

## 2023-06-07 HISTORY — PX: EXTRACORPOREAL SHOCK WAVE LITHOTRIPSY: SHX1557

## 2023-06-07 SURGERY — LITHOTRIPSY, ESWL
Anesthesia: Moderate Sedation | Laterality: Right

## 2023-06-07 MED ORDER — ONDANSETRON HCL 4 MG/2ML IJ SOLN
4.0000 mg | Freq: Once | INTRAMUSCULAR | Status: AC
  Administered 2023-06-07: 4 mg via INTRAVENOUS

## 2023-06-07 MED ORDER — OXYCODONE HCL 5 MG PO TABS
5.0000 mg | ORAL_TABLET | Freq: Four times a day (QID) | ORAL | 0 refills | Status: DC | PRN
  Filled 2023-06-07: qty 10, 3d supply, fill #0

## 2023-06-07 MED ORDER — CEPHALEXIN 500 MG PO CAPS
500.0000 mg | ORAL_CAPSULE | Freq: Once | ORAL | Status: AC
  Administered 2023-06-07: 500 mg via ORAL

## 2023-06-07 MED ORDER — DIPHENHYDRAMINE HCL 25 MG PO CAPS
ORAL_CAPSULE | ORAL | Status: AC
  Filled 2023-06-07: qty 1

## 2023-06-07 MED ORDER — DIAZEPAM 5 MG PO TABS
ORAL_TABLET | ORAL | Status: AC
  Filled 2023-06-07: qty 2

## 2023-06-07 MED ORDER — ONDANSETRON HCL 4 MG/2ML IJ SOLN
INTRAMUSCULAR | Status: AC
  Filled 2023-06-07: qty 2

## 2023-06-07 MED ORDER — DIPHENHYDRAMINE HCL 25 MG PO CAPS
25.0000 mg | ORAL_CAPSULE | ORAL | Status: AC
  Administered 2023-06-07: 25 mg via ORAL

## 2023-06-07 MED ORDER — OXYCODONE HCL 5 MG PO TABS
5.0000 mg | ORAL_TABLET | Freq: Four times a day (QID) | ORAL | 0 refills | Status: DC | PRN
Start: 2023-06-07 — End: 2023-06-07

## 2023-06-07 MED ORDER — CEPHALEXIN 500 MG PO CAPS
ORAL_CAPSULE | ORAL | Status: AC
  Filled 2023-06-07: qty 1

## 2023-06-07 MED ORDER — DIAZEPAM 5 MG PO TABS
10.0000 mg | ORAL_TABLET | ORAL | Status: AC
  Administered 2023-06-07: 10 mg via ORAL

## 2023-06-07 MED ORDER — SODIUM CHLORIDE 0.9 % IV SOLN: INTRAVENOUS | Status: DC

## 2023-06-07 NOTE — Discharge Instructions (Signed)

## 2023-06-07 NOTE — Interval H&P Note (Signed)
History and Physical Interval Note:  06/07/2023 11:33 AM  [name]  has presented today for surgery, with the diagnosis of right ureteral stone.  The various methods of treatment have been discussed with the patient and family. After consideration of risks, benefits and other options for treatment, the patient has consented to  Procedure(s): EXTRACORPOREAL SHOCK WAVE LITHOTRIPSY (ESWL) (Right) as a surgical intervention.  The patient's history has been reviewed, patient examined, no change in status, stable for surgery.  I have reviewed the patient's chart and labs.  Questions were answered to the patient's satisfaction.    RRR CTAB  Anthony Levine

## 2023-06-11 ENCOUNTER — Other Ambulatory Visit: Payer: Self-pay

## 2023-06-11 ENCOUNTER — Encounter: Payer: Self-pay | Admitting: Urology

## 2023-06-11 DIAGNOSIS — N201 Calculus of ureter: Secondary | ICD-10-CM

## 2023-06-11 NOTE — Group Note (Deleted)

## 2023-06-18 ENCOUNTER — Other Ambulatory Visit: Payer: Self-pay
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
## 2023-07-03 ENCOUNTER — Ambulatory Visit (INDEPENDENT_AMBULATORY_CARE_PROVIDER_SITE_OTHER): Payer: Medicaid Other | Admitting: Urology

## 2023-07-03 ENCOUNTER — Ambulatory Visit
Admission: RE | Admit: 2023-07-03 | Discharge: 2023-07-03 | Disposition: A | Discharge status: Home / Self Care | Payer: Medicaid Other | Source: Ambulatory Visit | Attending: Urology | Admitting: Urology

## 2023-07-03 ENCOUNTER — Encounter: Payer: Self-pay | Admitting: Urology

## 2023-07-03 ENCOUNTER — Ambulatory Visit
Admission: RE | Admit: 2023-07-03 | Discharge: 2023-07-03 | Disposition: A | Discharge status: Home / Self Care | Payer: Medicaid Other | Attending: Urology | Admitting: Urology

## 2023-07-03 VITALS — BP 147/109 | HR 67 | Ht 71.0 in | Wt 175.0 lb

## 2023-07-03 DIAGNOSIS — N201 Calculus of ureter: Secondary | ICD-10-CM

## 2023-07-03 DIAGNOSIS — Z87442 Personal history of urinary calculi: Secondary | ICD-10-CM

## 2023-07-03 DIAGNOSIS — Z09 Encounter for follow-up examination after completed treatment for conditions other than malignant neoplasm: Secondary | ICD-10-CM

## 2023-07-11 ENCOUNTER — Telehealth: Payer: Self-pay | Admitting: *Deleted

## 2023-07-11 LAB — CALCULI, WITH PHOTOGRAPH (CLINICAL LAB)
Calcium Oxalate Dihydrate: 30 %
Calcium Oxalate Monohydrate: 70 %
Weight Calculi: 22 mg

## 2023-07-11 NOTE — Telephone Encounter (Signed)
.  left message to have patient return my call.  

## 2023-07-11 NOTE — Telephone Encounter (Signed)
-----   Message from [facility] sent at 07/11/2023  3:14 PM EDT ----- Please let Mr. Anthony Levine know that his stone analysis revealed a calcium oxalate monohydrate stone.  We can mail or he can pick up information at the office on how to prevent these stones in the future.  Which would he prefer?

## 2023-07-12 NOTE — Telephone Encounter (Signed)
Spoke with patient and advised results Mailed kidney stone book to home address

## 2023-12-14 ENCOUNTER — Emergency Department
Admission: EM | Admit: 2023-12-14 | Discharge: 2023-12-14 | Disposition: A | Discharge status: Home / Self Care | Attending: Emergency Medicine | Admitting: Emergency Medicine

## 2023-12-14 DIAGNOSIS — K047 Periapical abscess without sinus: Secondary | ICD-10-CM | POA: Diagnosis present

## 2023-12-14 MED ORDER — AMOXICILLIN 500 MG PO CAPS
500.0000 mg | ORAL_CAPSULE | Freq: Three times a day (TID) | ORAL | 0 refills | Status: DC
Start: 2023-12-14 — End: 2023-12-20

## 2023-12-14 NOTE — Discharge Instructions (Addendum)
 Follow-up with your dentist for your already scheduled appointment.  Take ibuprofen for pain.  Use antibiotic as prescribed

## 2023-12-14 NOTE — ED Provider Notes (Signed)
 [facility] Provider Note    Event Date/Time   First MD Initiated Contact with Patient 12/14/23 1512     (approximate)   History   Abscess   HPI  Anthony Levine is a 48 y.o. male with no significant past medical history presents emergency department complaint of dental pain and abscess.  Patient states that swelling started few days ago.  Has an appointment with his regular dentist for next week.      Physical Exam   Triage Vital Signs: ED Triage Vitals  Encounter Vitals Group     BP 12/14/23 1348 (!) 171/104     Systolic BP Percentile --      Diastolic BP Percentile --      Pulse Rate 12/14/23 1348 84     Resp 12/14/23 1348 18     Temp 12/14/23 1348 98 F (36.7 C)     Temp src --      SpO2 12/14/23 1348 96 %     Weight 12/14/23 1347 180 lb (81.6 kg)     Height 12/14/23 1347 5\' 11"  (1.803 m)     Head Circumference --      Peak Flow --      Pain Score 12/14/23 1347 7     Pain Loc --      Pain Education --      Exclude from Growth Chart --     Most recent vital signs: Vitals:   12/14/23 1348  BP: (!) 171/104  Pulse: 84  Resp: 18  Temp: 98 F (36.7 C)  SpO2: 96%     General: Awake, no distress.   CV:  Good peripheral perfusion. regular rate and  rhythm Resp:  Normal effort. Lungs CTA Abd:  No distention.   Other:  Some tenderness noted on the right upper molar, tender along the right maxillary sinus, minimal swelling noted, no abscess noted to drain   ED Results / Procedures / Treatments   Labs (all labs ordered are listed, but only abnormal results are displayed) Labs Reviewed - No data to display   EKG     RADIOLOGY     PROCEDURES:   Procedures Chief Complaint  Patient presents with   Abscess      MEDICATIONS ORDERED IN ED: Medications - No data to display   IMPRESSION / MDM / ASSESSMENT AND PLAN / ED COURSE  I reviewed the triage vital signs and the nursing notes.                               Differential diagnosis includes, but is not limited to, dental pain, dental abscess, sinusitis  Patient's presentation is most consistent with acute illness / injury with system symptoms.   Patient's exam is consistent with dental pain and possible abscess.  Go ahead and place him on amoxicillin.  Take ibuprofen for pain.  Follow-up with the dentist that he has an appointment with her early next week.  Return for worsening.  Patient is in agreement treatment plan.  Discharged stable condition.      FINAL CLINICAL IMPRESSION(S) / ED DIAGNOSES   Final diagnoses:  Dental abscess     Rx / DC Orders   ED Discharge Orders          Ordered    amoxicillin (AMOXIL) 500 MG capsule  3 times daily        12/14/23 1521  Note:  This document was prepared using Dragon voice recognition software and may include unintentional dictation errors.    Faythe Ghee, PA-C 12/14/23 1524    Chesley Noon, MD 12/14/23 [phone]

## 2023-12-14 NOTE — ED Notes (Signed)
 See triage notes. Patient c/o right sided mouth pain. Patient believes he may have an abscess

## 2023-12-14 NOTE — ED Triage Notes (Signed)
 Pt comes with dental pain and possible abscess to right upper. Pt states this started 3 days ago.

## 2023-12-19 ENCOUNTER — Emergency Department
Admission: EM | Admit: 2023-12-19 | Discharge: 2023-12-19 | Disposition: A | Discharge status: Home / Self Care | Attending: Emergency Medicine | Admitting: Emergency Medicine

## 2023-12-19 ENCOUNTER — Other Ambulatory Visit: Payer: Self-pay

## 2023-12-19 DIAGNOSIS — S81011A Laceration without foreign body, right knee, initial encounter: Secondary | ICD-10-CM | POA: Insufficient documentation

## 2023-12-19 DIAGNOSIS — S81811A Laceration without foreign body, right lower leg, initial encounter: Secondary | ICD-10-CM

## 2023-12-19 DIAGNOSIS — Z23 Encounter for immunization: Secondary | ICD-10-CM | POA: Diagnosis not present

## 2023-12-19 DIAGNOSIS — X58XXXA Exposure to other specified factors, initial encounter: Secondary | ICD-10-CM | POA: Insufficient documentation

## 2023-12-19 DIAGNOSIS — W268XXA Contact with other sharp object(s), not elsewhere classified, initial encounter: Secondary | ICD-10-CM | POA: Diagnosis not present

## 2023-12-19 DIAGNOSIS — Z5321 Procedure and treatment not carried out due to patient leaving prior to being seen by health care provider: Secondary | ICD-10-CM | POA: Insufficient documentation

## 2023-12-19 DIAGNOSIS — S8991XA Unspecified injury of right lower leg, initial encounter: Secondary | ICD-10-CM | POA: Diagnosis present

## 2023-12-19 MED ORDER — TETANUS-DIPHTH-ACELL PERTUSSIS 5-2.5-18.5 LF-MCG/0.5 IM SUSY
0.5000 mL | PREFILLED_SYRINGE | Freq: Once | INTRAMUSCULAR | Status: AC
  Administered 2023-12-19: 0.5 mL via INTRAMUSCULAR
  Filled 2023-12-19: qty 0.5

## 2023-12-19 MED ORDER — LIDOCAINE HCL (PF) 1 % IJ SOLN
5.0000 mL | Freq: Once | INTRAMUSCULAR | Status: AC
  Filled 2023-12-19: qty 5

## 2023-12-19 MED ORDER — LIDOCAINE HCL (PF) 1 % IJ SOLN
INTRAMUSCULAR | Status: AC
  Administered 2023-12-19: 5 mL
  Filled 2023-12-19: qty 5
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
## 2023-12-19 NOTE — Discharge Instructions (Addendum)
 You were seen in the ER today for a laceration above your knee.  This was repaired with sutures.  These will dissolve on their own.  Keep the area clean and dry, but you can shower.  Return to the ER for any new or worsening symptoms.

## 2023-12-19 NOTE — ED Provider Notes (Signed)
 [facility] Provider Note    Event Date/Time   First MD Initiated Contact with Patient 12/19/23 [phone]     (approximate)   History   Laceration   HPI  Anthony Levine is a 48 year old male presenting to the ER for evaluation of knee laceration. Patient was using a razor when he accidentally cut himself above his right knee. Applied a sock as a dressing and bleeding is controlled. Denies injury to other areas. Unknown last tetanus update.        Physical Exam   Triage Vital Signs: ED Triage Vitals  Encounter Vitals Group     BP 12/19/23 0922 (!) 161/115     Systolic BP Percentile --      Diastolic BP Percentile --      Pulse Rate 12/19/23 0922 78     Resp 12/19/23 0922 18     Temp 12/19/23 0922 97.9 F (36.6 C)     Temp Source 12/19/23 0922 Oral     SpO2 12/19/23 0922 99 %     Weight --      Height --      Head Circumference --      Peak Flow --      Pain Score 12/19/23 0930 7     Pain Loc --      Pain Education --      Exclude from Growth Chart --     Most recent vital signs: Vitals:   12/19/23 0922  BP: (!) 161/115  Pulse: 78  Resp: 18  Temp: 97.9 F (36.6 C)  SpO2: 99%     General: Awake, interactive  CV:  Regular rate, good peripheral perfusion.  Resp:  Unlabored respirations.  Abd:  Nondistended.  Neuro:  Symmetric facial movement, fluid speecg MSK:   3 cm horizontal laceration above the kneecap over the distal thigh with associated gaping,bleeding controlled.    ED Results / Procedures / Treatments   Labs (all labs ordered are listed, but only abnormal results are displayed) Labs Reviewed - No data to display   EKG EKG independently reviewed interpreted by myself (ER attending) demonstrates:    RADIOLOGY Imaging independently reviewed and interpreted by myself demonstrates:    PROCEDURES:  Critical Care performed: No  .Laceration Repair  Date/Time: 12/19/2023 10:04 AM  Performed by: Trinna Post,  MD Authorized by: Trinna Post, MD   Consent:    Consent obtained:  Verbal   Consent given by:  Patient   Risks, benefits, and alternatives were discussed: yes   Anesthesia:    Anesthesia method:  Local infiltration   Local anesthetic:  Lidocaine 1% w/o epi Laceration details:    Location:  Leg   Leg location:  L upper leg   Length (cm):  3 Pre-procedure details:    Preparation:  Patient was prepped and draped in usual sterile fashion Treatment:    Area cleansed with:  Povidone-iodine   Irrigation solution:  Sterile saline Skin repair:    Repair method:  Sutures   Suture size:  4-0   Wound skin closure material used: monocryl.   Number of sutures:  3 Repair type:    Repair type:  Simple Post-procedure details:    Dressing:  Open (no dressing)   Procedure completion:  Tolerated    MEDICATIONS ORDERED IN ED: Medications  lidocaine (PF) (XYLOCAINE) 1 % injection 5 mL (5 mLs Infiltration Given by Other 12/19/23 1000)  Tdap (BOOSTRIX) injection 0.5 mL (0.5 mLs Intramuscular Given 12/19/23  1011)     IMPRESSION / MDM / ASSESSMENT AND PLAN / ED COURSE  I reviewed the triage vital signs and the nursing notes.  Differential diagnosis includes, but is not limited to, laceration without foreign body or evidence of neurovascular compromise  Patient's presentation is most consistent with acute, uncomplicated illness.  Present with knee laceration. Repaired with sutures as above. Tetanus updated. Strict return precautions provided. Patient discharged in stable condition.       FINAL CLINICAL IMPRESSION(S) / ED DIAGNOSES   Final diagnoses:  Leg laceration, right, initial encounter     Rx / DC Orders   ED Discharge Orders     None        Note:  This document was prepared using Dragon voice recognition software and may include unintentional dictation errors.   Trinna Post, MD 12/19/23 [phone]

## 2023-12-19 NOTE — ED Triage Notes (Signed)
 Pt arrives with c/o his stitches coming out. Pt had stitches put in this morning. Pt has laceration on his right knee. No bleeding noted.

## 2023-12-20 ENCOUNTER — Encounter: Payer: Self-pay | Admitting: Emergency Medicine

## 2023-12-20 ENCOUNTER — Emergency Department
Admission: EM | Admit: 2023-12-20 | Discharge: 2023-12-20 | Discharge status: Against Medical Advice | Source: Home / Self Care

## 2023-12-20 ENCOUNTER — Emergency Department
Admission: EM | Admit: 2023-12-20 | Discharge: 2023-12-20 | Disposition: A | Discharge status: Home / Self Care | Attending: Emergency Medicine | Admitting: Emergency Medicine

## 2023-12-20 ENCOUNTER — Other Ambulatory Visit: Payer: Self-pay

## 2023-12-20 DIAGNOSIS — Z87442 Personal history of urinary calculi: Secondary | ICD-10-CM | POA: Diagnosis not present

## 2023-12-20 DIAGNOSIS — W269XXD Contact with unspecified sharp object(s), subsequent encounter: Secondary | ICD-10-CM | POA: Diagnosis not present

## 2023-12-20 DIAGNOSIS — Z5189 Encounter for other specified aftercare: Secondary | ICD-10-CM

## 2023-12-20 DIAGNOSIS — Z4801 Encounter for change or removal of surgical wound dressing: Secondary | ICD-10-CM | POA: Diagnosis not present

## 2023-12-20 DIAGNOSIS — S81011D Laceration without foreign body, right knee, subsequent encounter: Secondary | ICD-10-CM | POA: Diagnosis not present

## 2023-12-20 DIAGNOSIS — S8991XD Unspecified injury of right lower leg, subsequent encounter: Secondary | ICD-10-CM | POA: Diagnosis present

## 2023-12-20 MED ORDER — LIDOCAINE HCL (PF) 1 % IJ SOLN
5.0000 mL | Freq: Once | INTRAMUSCULAR | Status: AC
  Administered 2023-12-20: 5 mL via INTRADERMAL
  Filled 2023-12-20: qty 5

## 2023-12-20 MED ORDER — CEPHALEXIN 500 MG PO CAPS: 500.0000 mg | ORAL_CAPSULE | Freq: Two times a day (BID) | ORAL | 0 refills | Status: AC

## 2023-12-20 NOTE — ED Notes (Signed)
 No answer when called several times from lobby

## 2023-12-20 NOTE — ED Notes (Signed)
 See triage note  Presents with open wound to right upper leg  States he cut himself yesterday morning  Was seen and had wound sutured  But the stiches popped open

## 2023-12-20 NOTE — ED Provider Notes (Signed)
 [facility] Provider Note    Event Date/Time   First MD Initiated Contact with Patient 12/20/23 [phone]     (approximate)   History   Wound Check   HPI  Anthony Levine is a 48 y.o. male with PMH of kidney stones presents for a wound check.  Patient cut himself yesterday morning and presented to the emergency department for sutures.  When he showered later that day he noticed that the sutures were gone and the wound had reopened.  He came back to the emergency department that afternoon but left without being seen due to the wait time.      Physical Exam   Triage Vital Signs: ED Triage Vitals  Encounter Vitals Group     BP 12/20/23 0734 (!) 165/107     Systolic BP Percentile --      Diastolic BP Percentile --      Pulse Rate 12/20/23 0734 61     Resp 12/20/23 0734 18     Temp 12/20/23 0734 97.8 F (36.6 C)     Temp Source 12/20/23 0734 Oral     SpO2 12/20/23 0734 97 %     Weight --      Height --      Head Circumference --      Peak Flow --      Pain Score 12/20/23 0732 0     Pain Loc --      Pain Education --      Exclude from Growth Chart --     Most recent vital signs: Vitals:   12/20/23 0734  BP: (!) 165/107  Pulse: 61  Resp: 18  Temp: 97.8 F (36.6 C)  SpO2: 97%   General: Awake, no distress.  CV:  Good peripheral perfusion.  Resp:  Normal effort.  Abd:  No distention.  Other:  Approximately 2 cm laceration just proximal to the right knee that is bleeding, no surrounding erythema.   ED Results / Procedures / Treatments   Labs (all labs ordered are listed, but only abnormal results are displayed) Labs Reviewed - No data to display   PROCEDURES:  Critical Care performed: No  .Laceration Repair  Date/Time: 12/20/2023 8:32 AM  Performed by: Cameron Ali, PA-C Authorized by: Cameron Ali, PA-C   Consent:    Consent obtained:  Verbal   Consent given by:  Patient   Risks, benefits, and  alternatives were discussed: yes     Risks discussed:  Infection, poor cosmetic result, poor wound healing and need for additional repair   Alternatives discussed:  No treatment Universal protocol:    Patient identity confirmed:  Verbally with patient Anesthesia:    Anesthesia method:  Local infiltration   Local anesthetic:  Lidocaine 1% w/o epi Laceration details:    Location:  Leg   Leg location:  R knee   Length (cm):  2   Depth (mm):  2 Pre-procedure details:    Preparation:  Patient was prepped and draped in usual sterile fashion Exploration:    Hemostasis achieved with:  Direct pressure   Wound exploration: entire depth of wound visualized   Treatment:    Area cleansed with:  Povidone-iodine   Amount of cleaning:  Standard   Irrigation solution:  Sterile saline   Irrigation volume:  30 mL   Irrigation method:  Syringe Skin repair:    Repair method:  Sutures   Suture size:  4-0   Suture material:  Prolene  Suture technique:  Simple interrupted   Number of sutures:  2 Approximation:    Approximation:  Loose Repair type:    Repair type:  Simple Post-procedure details:    Dressing:  Adhesive bandage   Procedure completion:  Tolerated well, no immediate complications    MEDICATIONS ORDERED IN ED: Medications  lidocaine (PF) (XYLOCAINE) 1 % injection 5 mL (5 mLs Intradermal Given 12/20/23 4098)     IMPRESSION / MDM / ASSESSMENT AND PLAN / ED COURSE  I reviewed the triage vital signs and the nursing notes.                             48 year old male presents for wound check.  Blood pressure is elevated otherwise vital signs are stable.  Patient NAD on exam.  Differential diagnosis includes, but is not limited to, wound infection, laceration.  Patient's presentation is most consistent with acute, uncomplicated illness.  No signs of infection at this time.  Wound is bleeding a little bit and gaping open.  Given the this happened about 24 hours ago, I explained to  the patient that there is an increased risk of infection with suturing it closed at this time.  Offered to do Steri-Strips. He states he is very active at work and is stepping up in and out of a [employer].  I explained that this may be further reason to avoid stitching it closed and that it may rip again.  Patient was adamant that he would like it to be closed with stitches.  See procedure note for further details.  He will be placed on prophylactic antibiotics. We discussed wound care. He voiced understanding, all questions were answered and he was stable at discharge.   FINAL CLINICAL IMPRESSION(S) / ED DIAGNOSES   Final diagnoses:  Encounter for wound re-check  Laceration of right knee, subsequent encounter     Rx / DC Orders   ED Discharge Orders          Ordered    cephALEXin (KEFLEX) 500 MG capsule  2 times daily        12/20/23 1191             Note:  This document was prepared using Dragon voice recognition software and may include unintentional dictation errors.   Cameron Ali, PA-C 12/20/23 0840    Concha Se, MD 12/20/23 1051

## 2023-12-20 NOTE — Discharge Instructions (Addendum)
 Sutures will need to be removed in about 10 days.  This can be done by your primary care provider, urgent care or the emergency department.  Make sure you wash the laceration daily with soap and water, pat dry and then cover with a bandage.  You can apply triple antibiotic ointment over the wound twice daily.  If the bandage gets wet please change it out for a dry one.  It is important that the skin stays clean and dry.  Take the antibiotics as prescribed. Watch for signs of infection including redness, warmth, swelling, pain and pus drainage.  If you develop any of these please return to the ED, urgent care or your primary care provider.  You can take 650 mg of Tylenol and 600 mg of ibuprofen every 6 hours as needed for pain.

## 2023-12-20 NOTE — ED Triage Notes (Signed)
 Pt via POV from home. Pt states that his stitches "popped open." States he got them done here yesterday. Denies any pain. Reports that he was here yesterday but LWBS. Bleeding controlled. Pt is A&OX4 and NAD, ambulatory to triage.

## 2024-06-21 ENCOUNTER — Emergency Department: Admission: EM | Admit: 2024-06-21 | Discharge: 2024-06-21 | Disposition: A | Discharge status: Home / Self Care

## 2024-06-21 ENCOUNTER — Other Ambulatory Visit: Payer: Self-pay

## 2024-06-21 ENCOUNTER — Encounter: Payer: Self-pay | Admitting: *Deleted

## 2024-06-21 ENCOUNTER — Emergency Department

## 2024-06-21 DIAGNOSIS — N503 Cyst of epididymis: Secondary | ICD-10-CM | POA: Insufficient documentation

## 2024-06-21 DIAGNOSIS — N5082 Scrotal pain: Secondary | ICD-10-CM | POA: Diagnosis present

## 2024-06-21 LAB — URINALYSIS, ROUTINE W REFLEX MICROSCOPIC
Bacteria, UA: NONE SEEN
Bilirubin Urine: NEGATIVE
Glucose, UA: NEGATIVE mg/dL
Ketones, ur: NEGATIVE mg/dL
Leukocytes,Ua: NEGATIVE
Nitrite: NEGATIVE
Protein, ur: NEGATIVE mg/dL
Specific Gravity, Urine: 1.03 (ref 1.005–1.030)
pH: 5 (ref 5.0–8.0)

## 2024-06-21 LAB — CHLAMYDIA/NGC RT PCR (ARMC ONLY)
Chlamydia Tr: NOT DETECTED
N gonorrhoeae: NOT DETECTED

## 2024-06-21 NOTE — ED Provider Notes (Signed)
 [facility] Provider Note    Event Date/Time   First MD Initiated Contact with Patient 06/21/24 1016     (approximate)   History   Testicle Pain   HPI  Anthony Levine is a 48 y.o. male who presents with 1 day of right scrotal pain found to have a a bump today.  Patient states that he has been in his usual state of health but he developed atraumatic right scrotal pain yesterday that he rated as a 8 out of 10.  The pain has largely resolved but today he has noticed a small bump on top of his scrotum.  He denies any concern for sexually transmitted diseases and tells me that he was just checked 3 months ago and has no known exposures however he does have a history of STDs in the past.  He has not had any purulent discharge from his penis and denies any color changes of his scrotum.  This is his only issue today      Physical Exam   Triage Vital Signs: ED Triage Vitals [06/21/24 1003]  Encounter Vitals Group     BP (!) 150/107     Girls Systolic BP Percentile      Girls Diastolic BP Percentile      Boys Systolic BP Percentile      Boys Diastolic BP Percentile      Pulse Rate 68     Resp 16     Temp 98.2 F (36.8 C)     Temp Source Oral     SpO2 98 %     Weight      Height      Head Circumference      Peak Flow      Pain Score 0     Pain Loc      Pain Education      Exclude from Growth Chart     Most recent vital signs: Vitals:   06/21/24 1003 06/21/24 1244  BP: (!) 150/107 (!) 151/90  Pulse: 68 69  Resp: 16 18  Temp: 98.2 F (36.8 C) 98.2 F (36.8 C)  SpO2: 98% 98%    Nursing Triage Note reviewed. Vital signs reviewed and patients oxygen saturation is normoxic  General: Patient is well nourished, well developed, awake and alert, resting comfortably in no acute distress Head: Normocephalic and atraumatic Eyes: Normal inspection, extraocular muscles intact, no conjunctival pallor Ear, nose, throat: Normal external  exam Neck: Normal range of motion Respiratory: Patient is in no respiratory distress Cardiovascular: Patient is not tachycardic, GI: Abd SNT with no guarding or rebound  Back: Normal inspection of the back with good strength and range of motion throughout all ext Extremities: pulses intact with good cap refills, no LE pitting edema or calf tenderness Neuro: The patient is alert and oriented to person, place, and time, appropriately conversive, with 5/5 bilat UE/LE strength, no gross motor or sensory defects noted. Coordination appears to be adequate. Skin: Warm, dry, and intact Psych: normal mood and affect, no SI or HI GU: Examined with nurse chaperone in the room.  There was no appreciable scrotal abnormality, no erythema, intact cremaster index, I am unable to palpate any masses.  No penile shaft abnormality  ED Results / Procedures / Treatments   Labs (all labs ordered are listed, but only abnormal results are displayed) Labs Reviewed  URINALYSIS, ROUTINE W REFLEX MICROSCOPIC - Abnormal; Notable for the following components:      Result Value  Color, Urine YELLOW (*)    APPearance CLEAR (*)    Hgb urine dipstick MODERATE (*)    All other components within normal limits  CHLAMYDIA/NGC RT PCR ([facility] ONLY)               EKG None  RADIOLOGY US  scrotum: No acute abnormality on my independent review and assessment however radiologist reads this as spermatocele    PROCEDURES:  Critical Care performed: No  Procedures   MEDICATIONS ORDERED IN ED: Medications - No data to display   IMPRESSION / MDM / ASSESSMENT AND PLAN / ED COURSE                                Differential diagnosis includes, but is not limited to, hydrocele, epididymitis, testicular mass, STD   ED course: Patient well-appearing and have no concern for testicular torsion based on exam today.  Patient declines blood work as he has recently been tested for STDs.  Urinalysis without abnormality.   Ultrasound consistent with spermatocele.  He was given reassurance and urology's number to arrange follow-up.  He will return if any acutely worsening symptoms or any other emergency  At time of discharge there is no evidence of acute life, limb, vision, or fertility threat. Patient has stable vital signs, pain is well controlled, patient is ambulatory and p.o. tolerant.  Discharge instructions were completed using the EPIC system. I would refer you to those at this time. All warnings prescriptions follow-up etc. were discussed in detail with the patient. Patient indicates understanding and is agreeable with this plan. All questions answered.  Patient is made aware that they may return to the emergency department for any worsening or new condition or for any other emergency.      --  COPA: 5 The patient has the following acute or chronic illness/injury that poses a possible threat to life or bodily function: [X] : The patient has a potentially serious acute condition or an acute exacerbation of a chronic illness requiring urgent evaluation and management in the Emergency Department. The clinical presentation necessitates immediate consideration of life-threatening or function-threatening diagnoses, even if they are ultimately ruled out.   FINAL CLINICAL IMPRESSION(S) / ED DIAGNOSES   Final diagnoses:  Epididymal cyst  Scrotum pain     Rx / DC Orders   ED Discharge Orders     None        Note:  This document was prepared using Dragon voice recognition software and may include unintentional dictation errors.   Nicholaus Rolland BRAVO, MD 06/21/24 [phone]

## 2024-06-21 NOTE — Discharge Instructions (Signed)
 You was seen in the emergency department for scrotal pain.  Workup demonstrated likely benign cyst.  Please call and make an appointment with the urology team.  Return with any acutely worsening symptoms or any other emergency. -- RETURN PRECAUTIONS & AFTERCARE: (ENGLISH) RETURN PRECAUTIONS: Return immediately to the emergency department or see/call your doctor if you feel worse, weak or have changes in speech or vision, are short of breath, have fever, vomiting, pain, bleeding or dark stool, trouble urinating or any new issues. Return here or see/call your doctor if not improving as expected for your suspected condition. FOLLOW-UP CARE: Call your doctor and/or any doctors we referred you to for more advice and to make an appointment. Do this today, tomorrow or after the weekend. Some doctors only take PPO insurance so if you have HMO insurance you may want to contact your HMO or your regular doctor for referral to a specialist within your plan. Either way tell the doctor's office that it was a referral from the emergency department so you get the soonest possible appointment.  YOUR TEST RESULTS: Take result reports of any blood or urine tests, imaging tests and EKG's to your doctor and any referral doctor. Have any abnormal tests repeated. Your doctor or a referral doctor can let you know when this should be done. Also make sure your doctor contacts this hospital to get any test results that are not currently available such as cultures or special tests for infection and final imaging reports, which are often not available at the time you leave the ER but which may list additional important findings that are not documented on the preliminary report. BLOOD PRESSURE: If your blood pressure was greater than 120/80 have your blood pressure rechecked within 1 to 2 weeks. MEDICATION SIDE EFFECTS: Do not drive, walk, bike, take the bus, etc. if you have received or are being prescribed any sedating medications such  as those for pain or anxiety or certain antihistamines like Benadryl . If you have been give one of these here get a taxi home or have a friend drive you home. Ask your pharmacist to counsel you on potential side effects of any new medication

## 2024-06-21 NOTE — ED Notes (Signed)
 US  at bedside.

## 2024-06-21 NOTE — ED Triage Notes (Signed)
 Pt states that he had some heaviness in his right testicle which he felt Monday but resolved.  Pt then noticed a pea sized lump on his right testicle yesterday, he states that he did not feel anything on his left testicle.  No pain at this time.
